# Patient Record
Sex: Male | Born: 1950 | Race: White | Hispanic: No | Marital: Married | Smoking: Former smoker
Health system: Southern US, Community
[De-identification: ages and names within clinical notes are randomized; demographics above are authoritative.]

## PROBLEM LIST (undated history)

## (undated) DIAGNOSIS — E7800 Pure hypercholesterolemia, unspecified: Secondary | ICD-10-CM

## (undated) DIAGNOSIS — K635 Polyp of colon: Secondary | ICD-10-CM

## (undated) DIAGNOSIS — T7840XA Allergy, unspecified, initial encounter: Secondary | ICD-10-CM

## (undated) DIAGNOSIS — K219 Gastro-esophageal reflux disease without esophagitis: Secondary | ICD-10-CM

## (undated) DIAGNOSIS — I10 Essential (primary) hypertension: Secondary | ICD-10-CM

## (undated) DIAGNOSIS — K589 Irritable bowel syndrome without diarrhea: Secondary | ICD-10-CM

## (undated) DIAGNOSIS — H269 Unspecified cataract: Secondary | ICD-10-CM

## (undated) DIAGNOSIS — C439 Malignant melanoma of skin, unspecified: Secondary | ICD-10-CM

## (undated) DIAGNOSIS — J189 Pneumonia, unspecified organism: Secondary | ICD-10-CM

## (undated) HISTORY — DX: Malignant melanoma of skin, unspecified: C43.9

## (undated) HISTORY — DX: Polyp of colon: K63.5

## (undated) HISTORY — DX: Gastro-esophageal reflux disease without esophagitis: K21.9

## (undated) HISTORY — PX: POLYPECTOMY: SHX149

## (undated) HISTORY — DX: Unspecified cataract: H26.9

## (undated) HISTORY — DX: Allergy, unspecified, initial encounter: T78.40XA

## (undated) HISTORY — DX: Pure hypercholesterolemia, unspecified: E78.00

## (undated) HISTORY — DX: Pneumonia, unspecified organism: J18.9

## (undated) HISTORY — PX: MOHS SURGERY: SHX181

## (undated) HISTORY — DX: Irritable bowel syndrome, unspecified: K58.9

---

## 1977-01-28 DIAGNOSIS — C439 Malignant melanoma of skin, unspecified: Secondary | ICD-10-CM

## 1977-01-28 HISTORY — PX: OTHER SURGICAL HISTORY: SHX169

## 1977-01-28 HISTORY — DX: Malignant melanoma of skin, unspecified: C43.9

## 1993-01-28 DIAGNOSIS — J189 Pneumonia, unspecified organism: Secondary | ICD-10-CM

## 1993-01-28 HISTORY — DX: Pneumonia, unspecified organism: J18.9

## 2011-01-29 DIAGNOSIS — K635 Polyp of colon: Secondary | ICD-10-CM

## 2011-01-29 HISTORY — DX: Polyp of colon: K63.5

## 2011-10-19 ENCOUNTER — Encounter (HOSPITAL_COMMUNITY): Payer: Self-pay | Admitting: *Deleted

## 2011-10-19 ENCOUNTER — Encounter (HOSPITAL_COMMUNITY): Payer: Self-pay | Admitting: Anesthesiology

## 2011-10-19 ENCOUNTER — Inpatient Hospital Stay (HOSPITAL_COMMUNITY): Payer: BC Managed Care – PPO | Admitting: Anesthesiology

## 2011-10-19 ENCOUNTER — Encounter (HOSPITAL_COMMUNITY)
Admission: EM | Disposition: A | Discharge status: Home / Self Care | Payer: Self-pay | Source: Other Acute Inpatient Hospital | Attending: Emergency Medicine

## 2011-10-19 ENCOUNTER — Inpatient Hospital Stay (HOSPITAL_COMMUNITY)
Admission: EM | Admit: 2011-10-19 | Discharge: 2011-10-23 | DRG: 002 | Disposition: A | Discharge status: Home / Self Care | Payer: BC Managed Care – PPO | Source: Other Acute Inpatient Hospital | Attending: Internal Medicine | Admitting: Internal Medicine

## 2011-10-19 DIAGNOSIS — D649 Anemia, unspecified: Secondary | ICD-10-CM | POA: Diagnosis present

## 2011-10-19 DIAGNOSIS — F1010 Alcohol abuse, uncomplicated: Secondary | ICD-10-CM

## 2011-10-19 DIAGNOSIS — S065XAA Traumatic subdural hemorrhage with loss of consciousness status unknown, initial encounter: Secondary | ICD-10-CM

## 2011-10-19 DIAGNOSIS — C439 Malignant melanoma of skin, unspecified: Secondary | ICD-10-CM

## 2011-10-19 DIAGNOSIS — I6200 Nontraumatic subdural hemorrhage, unspecified: Principal | ICD-10-CM

## 2011-10-19 DIAGNOSIS — Z85820 Personal history of malignant melanoma of skin: Secondary | ICD-10-CM

## 2011-10-19 DIAGNOSIS — G8190 Hemiplegia, unspecified affecting unspecified side: Secondary | ICD-10-CM | POA: Diagnosis present

## 2011-10-19 DIAGNOSIS — I10 Essential (primary) hypertension: Secondary | ICD-10-CM

## 2011-10-19 DIAGNOSIS — E785 Hyperlipidemia, unspecified: Secondary | ICD-10-CM | POA: Diagnosis present

## 2011-10-19 DIAGNOSIS — S065X9A Traumatic subdural hemorrhage with loss of consciousness of unspecified duration, initial encounter: Secondary | ICD-10-CM

## 2011-10-19 HISTORY — DX: Essential (primary) hypertension: I10

## 2011-10-19 HISTORY — PX: CRANIOTOMY: SHX93

## 2011-10-19 LAB — BLOOD GAS, ARTERIAL
Acid-base deficit: 0.3 mmol/L (ref 0.0–2.0)
Drawn by: 10552
FIO2: 0.21 %
O2 Saturation: 96.8 %
pCO2 arterial: 33.2 mmHg — ABNORMAL LOW (ref 35.0–45.0)
pO2, Arterial: 80.8 mmHg (ref 80.0–100.0)

## 2011-10-19 LAB — CBC
HCT: 43.3 % (ref 39.0–52.0)
Hemoglobin: 15.1 g/dL (ref 13.0–17.0)
MCV: 91.4 fL (ref 78.0–100.0)
RDW: 12.7 % (ref 11.5–15.5)
WBC: 6.8 10*3/uL (ref 4.0–10.5)

## 2011-10-19 LAB — COMPREHENSIVE METABOLIC PANEL
ALT: 22 U/L (ref 0–53)
AST: 21 U/L (ref 0–37)
Albumin: 4.4 g/dL (ref 3.5–5.2)
Calcium: 10 mg/dL (ref 8.4–10.5)
GFR calc non Af Amer: >90 mL/min (ref >90)
Glucose, Bld: 98 mg/dL (ref 70–99)
Potassium: 4 mEq/L (ref 3.5–5.1)

## 2011-10-19 LAB — PROTIME-INR: INR: 1.04 (ref 0.00–1.49)

## 2011-10-19 LAB — APTT: aPTT: 35 seconds (ref 24–37)

## 2011-10-19 LAB — PHOSPHORUS: Phosphorus: 2.4 mg/dL (ref 2.3–4.6)

## 2011-10-19 SURGERY — CRANIOTOMY HEMATOMA EVACUATION SUBDURAL
Anesthesia: General | Site: Head | Laterality: Left | Wound class: Clean

## 2011-10-19 MED ORDER — THIAMINE HCL 100 MG/ML IJ SOLN
100.0000 mg | Freq: Every day | INTRAMUSCULAR | Status: DC
  Filled 2011-10-19: qty 1

## 2011-10-19 MED ORDER — SODIUM CHLORIDE 0.9 % IV SOLN
500.0000 mg | Freq: Two times a day (BID) | INTRAVENOUS | Status: DC
  Administered 2011-10-20: 500 mg via INTRAVENOUS
  Filled 2011-10-19 (×2): qty 5

## 2011-10-19 MED ORDER — 0.9 % SODIUM CHLORIDE (POUR BTL) OPTIME
TOPICAL | Status: DC | PRN
  Administered 2011-10-19 (×2): 1000 mL

## 2011-10-19 MED ORDER — SODIUM CHLORIDE 0.9 % IJ SOLN
3.0000 mL | Freq: Two times a day (BID) | INTRAMUSCULAR | Status: DC
  Administered 2011-10-20 (×2): 3 mL via INTRAVENOUS

## 2011-10-19 MED ORDER — SODIUM CHLORIDE 0.9 % IV SOLN
INTRAVENOUS | Status: DC
  Administered 2011-10-20: via INTRAVENOUS

## 2011-10-19 MED ORDER — FENTANYL CITRATE 0.05 MG/ML IJ SOLN
INTRAMUSCULAR | Status: DC | PRN
  Administered 2011-10-19 (×2): 50 ug via INTRAVENOUS
  Administered 2011-10-19 (×3): 100 ug via INTRAVENOUS

## 2011-10-19 MED ORDER — ROCURONIUM BROMIDE 100 MG/10ML IV SOLN
INTRAVENOUS | Status: DC | PRN
  Administered 2011-10-19: 50 mg via INTRAVENOUS

## 2011-10-19 MED ORDER — HYDROMORPHONE HCL PF 1 MG/ML IJ SOLN
INTRAMUSCULAR | Status: AC
  Filled 2011-10-19: qty 1

## 2011-10-19 MED ORDER — THROMBIN 20000 UNITS EX KIT
PACK | CUTANEOUS | Status: DC | PRN
  Administered 2011-10-19: 21:00:00 via TOPICAL

## 2011-10-19 MED ORDER — SODIUM CHLORIDE 0.9 % IV SOLN: 250.0000 mL | INTRAVENOUS | Status: DC

## 2011-10-19 MED ORDER — VITAMIN B-1 100 MG PO TABS
100.0000 mg | ORAL_TABLET | Freq: Every day | ORAL | Status: DC
  Administered 2011-10-19: 100 mg via ORAL
  Filled 2011-10-19: qty 1

## 2011-10-19 MED ORDER — BACITRACIN ZINC 500 UNIT/GM EX OINT
TOPICAL_OINTMENT | CUTANEOUS | Status: DC | PRN
  Administered 2011-10-19: 1 via TOPICAL

## 2011-10-19 MED ORDER — OXYCODONE HCL 5 MG PO TABS: 5.0000 mg | ORAL_TABLET | Freq: Once | ORAL | Status: AC | PRN

## 2011-10-19 MED ORDER — LIDOCAINE HCL (CARDIAC) 20 MG/ML IV SOLN
INTRAVENOUS | Status: DC | PRN
  Administered 2011-10-19 (×2): 100 mg via INTRAVENOUS

## 2011-10-19 MED ORDER — FOLIC ACID 1 MG PO TABS
1.0000 mg | ORAL_TABLET | Freq: Every day | ORAL | Status: DC
  Administered 2011-10-19: 1 mg via ORAL
  Filled 2011-10-19: qty 1

## 2011-10-19 MED ORDER — SODIUM CHLORIDE 0.9 % IV SOLN
10.0000 mg | INTRAVENOUS | Status: DC | PRN
  Administered 2011-10-19: 25 ug/min via INTRAVENOUS

## 2011-10-19 MED ORDER — SODIUM CHLORIDE 0.9 % IV SOLN
INTRAVENOUS | Status: DC | PRN
  Administered 2011-10-19 (×2): via INTRAVENOUS

## 2011-10-19 MED ORDER — SODIUM CHLORIDE 0.9 % IV SOLN
INTRAVENOUS | Status: AC
  Filled 2011-10-19: qty 500

## 2011-10-19 MED ORDER — DEXTROSE 5 % IV SOLN
INTRAVENOUS | Status: DC | PRN
  Administered 2011-10-19: 21:00:00 via INTRAVENOUS

## 2011-10-19 MED ORDER — SODIUM CHLORIDE 0.9 % IV SOLN
INTRAVENOUS | Status: DC
  Administered 2011-10-19: 17:00:00 via INTRAVENOUS

## 2011-10-19 MED ORDER — PROPOFOL 10 MG/ML IV BOLUS
INTRAVENOUS | Status: DC | PRN
  Administered 2011-10-19: 200 mg via INTRAVENOUS
  Administered 2011-10-19 (×2): 50 mg via INTRAVENOUS

## 2011-10-19 MED ORDER — NICARDIPINE HCL IN NACL 20-0.86 MG/200ML-% IV SOLN
INTRAVENOUS | Status: DC | PRN
  Administered 2011-10-19: 5 mg/h via INTRAVENOUS

## 2011-10-19 MED ORDER — BUPIVACAINE HCL (PF) 0.25 % IJ SOLN
INTRAMUSCULAR | Status: DC | PRN
  Administered 2011-10-19: 5 mL

## 2011-10-19 MED ORDER — ADULT MULTIVITAMIN W/MINERALS CH
1.0000 | ORAL_TABLET | Freq: Every day | ORAL | Status: DC
  Administered 2011-10-19: 1 via ORAL
  Filled 2011-10-19: qty 1

## 2011-10-19 MED ORDER — PANTOPRAZOLE SODIUM 40 MG IV SOLR
40.0000 mg | Freq: Every day | INTRAVENOUS | Status: DC
  Filled 2011-10-19: qty 40

## 2011-10-19 MED ORDER — ONDANSETRON HCL 4 MG/2ML IJ SOLN: 4.0000 mg | Freq: Once | INTRAMUSCULAR | Status: AC | PRN

## 2011-10-19 MED ORDER — CEFAZOLIN SODIUM 1-5 GM-% IV SOLN
INTRAVENOUS | Status: AC
  Administered 2011-10-19: 2 g via INTRAVENOUS
  Filled 2011-10-19: qty 100

## 2011-10-19 MED ORDER — LORAZEPAM 2 MG/ML IJ SOLN: 1.0000 mg | Freq: Four times a day (QID) | INTRAMUSCULAR | Status: DC | PRN

## 2011-10-19 MED ORDER — MICROFIBRILLAR COLL HEMOSTAT EX PADS
MEDICATED_PAD | CUTANEOUS | Status: DC | PRN
  Administered 2011-10-19: 1 via TOPICAL

## 2011-10-19 MED ORDER — ONDANSETRON HCL 4 MG/2ML IJ SOLN
INTRAMUSCULAR | Status: DC | PRN
  Administered 2011-10-19: 4 mg via INTRAVENOUS

## 2011-10-19 MED ORDER — SODIUM CHLORIDE 0.9 % IR SOLN
Status: DC | PRN
  Administered 2011-10-19: 21:00:00

## 2011-10-19 MED ORDER — NICARDIPINE HCL IN NACL 20-0.86 MG/200ML-% IV SOLN
5.0000 mg/h | INTRAVENOUS | Status: DC
Start: 2011-10-19 — End: 2011-10-19
  Filled 2011-10-19 (×2): qty 200

## 2011-10-19 MED ORDER — LABETALOL HCL 5 MG/ML IV SOLN
INTRAVENOUS | Status: DC | PRN
  Administered 2011-10-19: 10 mg via INTRAVENOUS
  Administered 2011-10-19: 5 mg via INTRAVENOUS

## 2011-10-19 MED ORDER — GLYCOPYRROLATE 0.2 MG/ML IJ SOLN
INTRAMUSCULAR | Status: DC | PRN
  Administered 2011-10-19: .6 mg via INTRAVENOUS

## 2011-10-19 MED ORDER — SODIUM CHLORIDE 0.9 % IJ SOLN: 3.0000 mL | INTRAMUSCULAR | Status: DC | PRN

## 2011-10-19 MED ORDER — OXYCODONE HCL 5 MG/5ML PO SOLN: 5.0000 mg | Freq: Once | ORAL | Status: AC | PRN

## 2011-10-19 MED ORDER — NEOSTIGMINE METHYLSULFATE 1 MG/ML IJ SOLN
INTRAMUSCULAR | Status: DC | PRN
  Administered 2011-10-19: 5 mg via INTRAVENOUS

## 2011-10-19 MED ORDER — LIDOCAINE-EPINEPHRINE 1 %-1:100000 IJ SOLN
INTRAMUSCULAR | Status: DC | PRN
  Administered 2011-10-19: 5 mL via INTRADERMAL

## 2011-10-19 MED ORDER — BACITRACIN 50000 UNITS IM SOLR
INTRAMUSCULAR | Status: AC
  Filled 2011-10-19: qty 1

## 2011-10-19 MED ORDER — HYDROMORPHONE HCL PF 1 MG/ML IJ SOLN
0.2500 mg | INTRAMUSCULAR | Status: DC | PRN
  Administered 2011-10-19 (×4): 0.5 mg via INTRAVENOUS

## 2011-10-19 MED ORDER — LORAZEPAM 1 MG PO TABS: 1.0000 mg | ORAL_TABLET | Freq: Four times a day (QID) | ORAL | Status: DC | PRN

## 2011-10-19 SURGICAL SUPPLY — 74 items
BAG DECANTER FOR FLEXI CONT (MISCELLANEOUS) ×2 IMPLANT
BANDAGE GAUZE 4  KLING STR (GAUZE/BANDAGES/DRESSINGS) ×4 IMPLANT
BANDAGE GAUZE ELAST BULKY 4 IN (GAUZE/BANDAGES/DRESSINGS) ×6 IMPLANT
BIT DRILL WIRE PASS 1.3MM (BIT) IMPLANT
BRUSH SCRUB EZ PLAIN DRY (MISCELLANEOUS) ×2 IMPLANT
BUR ACORN 6.0 (BURR) ×2 IMPLANT
BUR ADDG 1.1 (BURR) IMPLANT
BUR ROUTER D-58 CRANI (BURR) ×2 IMPLANT
CANISTER SUCTION 2500CC (MISCELLANEOUS) ×2 IMPLANT
CLIP TI MEDIUM 6 (CLIP) IMPLANT
CLOTH BEACON ORANGE TIMEOUT ST (SAFETY) ×2 IMPLANT
CONT SPEC 4OZ CLIKSEAL STRL BL (MISCELLANEOUS) ×2 IMPLANT
CORDS BIPOLAR (ELECTRODE) ×2 IMPLANT
DECANTER SPIKE VIAL GLASS SM (MISCELLANEOUS) ×2 IMPLANT
DRAIN CHANNEL 10M FLAT 3/4 FLT (DRAIN) IMPLANT
DRAIN PENROSE 1/2X12 LTX STRL (WOUND CARE) IMPLANT
DRAIN SNY WOU 7FLT (WOUND CARE) ×2 IMPLANT
DRAPE SURG IRRIG POUCH 19X23 (DRAPES) IMPLANT
DRAPE WARM FLUID 44X44 (DRAPE) ×2 IMPLANT
DRILL WIRE PASS 1.3MM (BIT)
DRSG ADAPTIC 3X8 NADH LF (GAUZE/BANDAGES/DRESSINGS) IMPLANT
DRSG PAD ABDOMINAL 8X10 ST (GAUZE/BANDAGES/DRESSINGS) IMPLANT
DURAPREP 6ML APPLICATOR 50/CS (WOUND CARE) ×2 IMPLANT
ELECT CAUTERY BLADE 6.4 (BLADE) ×2 IMPLANT
ELECT REM PT RETURN 9FT ADLT (ELECTROSURGICAL) ×2
ELECTRODE REM PT RTRN 9FT ADLT (ELECTROSURGICAL) ×1 IMPLANT
EVACUATOR SILICONE 100CC (DRAIN) ×2 IMPLANT
GAUZE SPONGE 4X4 16PLY XRAY LF (GAUZE/BANDAGES/DRESSINGS) IMPLANT
GLOVE BIO SURGEON STRL SZ7 (GLOVE) ×6 IMPLANT
GLOVE BIO SURGEON STRL SZ7.5 (GLOVE) IMPLANT
GLOVE BIOGEL PI IND STRL 7.5 (GLOVE) IMPLANT
GLOVE BIOGEL PI IND STRL 8.5 (GLOVE) ×2 IMPLANT
GLOVE BIOGEL PI INDICATOR 7.5 (GLOVE)
GLOVE BIOGEL PI INDICATOR 8.5 (GLOVE) ×2
GLOVE ECLIPSE 8.5 STRL (GLOVE) ×4 IMPLANT
GLOVE EXAM NITRILE LRG STRL (GLOVE) IMPLANT
GLOVE EXAM NITRILE MD LF STRL (GLOVE) IMPLANT
GLOVE EXAM NITRILE XL STR (GLOVE) IMPLANT
GLOVE EXAM NITRILE XS STR PU (GLOVE) IMPLANT
GLOVE INDICATOR 7.0 STRL GRN (GLOVE) ×4 IMPLANT
GOWN BRE IMP SLV AUR LG STRL (GOWN DISPOSABLE) ×2 IMPLANT
GOWN BRE IMP SLV AUR XL STRL (GOWN DISPOSABLE) ×2 IMPLANT
GOWN STRL REIN 2XL LVL4 (GOWN DISPOSABLE) ×2 IMPLANT
HEMOSTAT SURGICEL 2X14 (HEMOSTASIS) IMPLANT
HOOK DURA (MISCELLANEOUS) IMPLANT
KIT BASIN OR (CUSTOM PROCEDURE TRAY) ×2 IMPLANT
KIT ROOM TURNOVER OR (KITS) ×2 IMPLANT
NEEDLE HYPO 22GX1.5 SAFETY (NEEDLE) ×2 IMPLANT
NS IRRIG 1000ML POUR BTL (IV SOLUTION) ×2 IMPLANT
PACK CRANIOTOMY (CUSTOM PROCEDURE TRAY) ×2 IMPLANT
PATTIES SURGICAL .5 X.5 (GAUZE/BANDAGES/DRESSINGS) IMPLANT
PATTIES SURGICAL .5 X3 (DISPOSABLE) IMPLANT
PATTIES SURGICAL 1X1 (DISPOSABLE) IMPLANT
PIN MAYFIELD SKULL DISP (PIN) ×2 IMPLANT
PLATE 1.5  2HOLE LNG NEURO (Plate) ×4 IMPLANT
PLATE 1.5 2HOLE LNG NEURO (Plate) ×4 IMPLANT
SCREW SELF DRILL HT 1.5/4MM (Screw) ×16 IMPLANT
SPECIMEN JAR SMALL (MISCELLANEOUS) IMPLANT
SPONGE GAUZE 4X4 12PLY (GAUZE/BANDAGES/DRESSINGS) ×4 IMPLANT
SPONGE NEURO XRAY DETECT 1X3 (DISPOSABLE) IMPLANT
SPONGE SURGIFOAM ABS GEL 100 (HEMOSTASIS) IMPLANT
STAPLER SKIN PROX WIDE 3.9 (STAPLE) ×2 IMPLANT
SUT ETHILON 3 0 FSL (SUTURE) ×2 IMPLANT
SUT NURALON 4 0 TR CR/8 (SUTURE) ×4 IMPLANT
SUT VIC AB 2-0 CP2 18 (SUTURE) ×4 IMPLANT
SYR 20ML ECCENTRIC (SYRINGE) ×2 IMPLANT
SYR CONTROL 10ML LL (SYRINGE) ×2 IMPLANT
TAPE CLOTH 1X10 TAN NS (GAUZE/BANDAGES/DRESSINGS) ×2 IMPLANT
TOWEL OR 17X24 6PK STRL BLUE (TOWEL DISPOSABLE) ×2 IMPLANT
TOWEL OR 17X26 10 PK STRL BLUE (TOWEL DISPOSABLE) ×2 IMPLANT
TRAP SPECIMEN MUCOUS 40CC (MISCELLANEOUS) IMPLANT
TRAY FOLEY CATH 14FRSI W/METER (CATHETERS) ×2 IMPLANT
UNDERPAD 30X30 INCONTINENT (UNDERPADS AND DIAPERS) IMPLANT
WATER STERILE IRR 1000ML POUR (IV SOLUTION) ×2 IMPLANT

## 2011-10-19 NOTE — Anesthesia Postprocedure Evaluation (Signed)
  Anesthesia Post-op Note  Patient: Richard Kramer  Procedure(s) Performed: Procedure(s) (LRB) with comments: CRANIOTOMY HEMATOMA EVACUATION SUBDURAL (Left) - Craniotomy for subdural hematoma  Patient Location: PACU  Anesthesia Type: General  Level of Consciousness: awake and alert   Airway and Oxygen Therapy: Patient Spontanous Breathing and Patient connected to nasal cannula oxygen  Post-op Pain: mild  Post-op Assessment: Post-op Vital signs reviewed  Post-op Vital Signs: Reviewed  Complications: No apparent anesthesia complications

## 2011-10-19 NOTE — Op Note (Signed)
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 endotracheal Indications: Patient is a 61 year old individual is had significant headache for some period of time and now has developed right lower extremity weakness some confusion unsteadiness and gait. An MRI of the brain was performed and this demonstrated an acute on chronic left frontoparietal subdural hematoma. The hematoma was multiloculated and required craniotomy for evacuation.  Procedure the patient was brought to the operating room placed on the table in supine position after the smooth induction of general endotracheal anesthesia and the placement of appropriate monitoring lines including an arterial line and a Foley catheter the patient was placed in a 3 pin headrest with the head turned to the right a bump placed on the left shoulder the head was shaved was prepped with alcohol and DuraPrep and draped in a sterile fashion linear incision was created from the posterior aspect of the ear to the vertex of the scalp. Hemostasis was maintained and the galea the bipolar cautery and some rainy clips of the underlying periosteum was stripped from the bone and a large craniotomy flap was raised in this region toward the frontal region in the parietal region the bone was laid aside. The dura was noted to be tense it is discolored a dark brownish purplish U. A linear incision was created in the dura and it was opened a cruciate fashion relieving substantial pressure from fluid that was the color of motor oil and thick. There was portions of acute blood clot in this area also and these were removed the wound was irrigated copiously septated membranes were noted and these  were opened. A visceral membrane was noted on the surface of the brain this was incised and then by urinating underneath the membrane could be lifted and it was opened to connect the subdural space and remove a good portion of the visceral membrane off the brain. A sample of this membrane was sent for pathologic examination. The area was carefully inspected under the edges of the bone flap and hematoma and membrane were opened into the farthest reaches of the craniotomy defect. Once this was accomplished and hemostasis was well achieved the dural edges were closed loosely after being cauterized hemostasis. A 7 mm Jackson-Pratt drain was then placed in the posterior aspect of the craniotomy into the frontal pole. This was brought out through separate stab incision. The bone flap was then replaced after placing some tack up sutures in the center of the flap and placing tack ups around the perimetry. For small titanium plates were used to secure the bone flap in position. Central tack up was placed. Once this was accomplished the wound was copiously irrigated with antibiotic irrigant solution and then the galea was closed with 2-0 Vicryl in an inverted interrupted fashion and surgical staples were placed in the skin blood loss was estimated at 100 cc the drain was connected to a grenade which was not charged. The patient tolerated procedure well.

## 2011-10-19 NOTE — Preoperative (Signed)
Beta Blockers   Reason not to administer Beta Blockers:Not Applicable 

## 2011-10-19 NOTE — Anesthesia Procedure Notes (Signed)
Procedure Name: Intubation Date/Time: 10/19/2011 8:33 PM Performed by: Wray Kearns A Pre-anesthesia Checklist: Patient identified, Timeout performed, Emergency Drugs available, Suction available and Patient being monitored Patient Re-evaluated:Patient Re-evaluated prior to inductionOxygen Delivery Method: Circle system utilized Preoxygenation: Pre-oxygenation with 100% oxygen Intubation Type: IV induction and Cricoid Pressure applied Ventilation: Mask ventilation without difficulty and Oral airway inserted - appropriate to patient size Laryngoscope Size: Mac and 3 Grade View: Grade I Tube type: Oral Tube size: 8.0 mm Number of attempts: 1 Airway Equipment and Method: Stylet Placement Confirmation: ETT inserted through vocal cords under direct vision,  breath sounds checked- equal and bilateral and positive ETCO2 Secured at: 22 cm Tube secured with: Tape Dental Injury: Teeth and Oropharynx as per pre-operative assessment

## 2011-10-19 NOTE — H&P (Signed)
Name: Richard Kramer MRN: [id number] DOB: 06-29-1950    LOS: 0  Referring Provider:  Danielle Dess Reason for Referral: Acute on chronic SDH.  PULMONARY / CRITICAL CARE MEDICINE  HPI:  61 yo WM 5-6 beer per day smoker x 30 years, former smoker, unemployed who was diganosed with acute on chronic SDH when he developed Left arm weakness. He was transported from [facility] on 9/21 for the services of Dr. Danielle Dess of NSGY. PCCM admitted. PMH: HTN etoh abuse melanoma removal in 70's Prior to Admission medications   Pending HTN med Allergies PCN Family History Reviewed Social History Former smoker quit 4 years ago. Unemployed married. Review Of Systems:  Taken extensively see hpi  Brief patient description:  WNWDWMNAD  Events Since Admission: 9/21 admitted to 3100  Current Status: Stable Vital Signs: No data found.     Physical Examination: General:  *WNWDWM Neuro:  Intact HEENT:  No LAN Neck:  No Jvd Cardiovascular:  HSR RRR Lungs:  CTA Abdomen:  Soft +bs Musculoskeletal:  intact Skin:  Old scar rt kidney area Active Problems:  Subdural hematoma, acute  HTN (hypertension)  Melanoma  ETOH abuse  ASSESSMENT AND PLAN  PULMONARY No results found for this basename: PHART:5,PCO2:5,PCO2ART:5,PO2ART:5,HCO3:5,O2SAT:5 in the last 168 hours Ventilator Settings:   ETT:    A:  Former smoker. No acute issue   P:     CARDIOVASCULAR No results found for this basename: TROPONINI:5,LATICACIDVEN:5, O2SATVEN:5,PROBNP:5 in the last 168 hours ECG:   Lines:   A  HTN P:  >home meds if SBP > 140, DBP > 85  RENAL No results found for this basename: NA:5,K:2,CL:5,CO2:5,BUN:5,CREATININE:5,CALCIUM:5,MG:5,PHOS:5 in the last 168 hours Intake/Output    None    Foley:    A:  No acute issue   P:   - am BMP  GASTROINTESTINAL No results found for this basename: AST:5,ALT:5,ALKPHOS:5,BILITOT:5,PROT:5,ALBUMIN:5 in the last 168 hours  A:  No acute issue   P:      HEMATOLOGIC No results found for this basename: HGB:5,HCT:5,PLT:5,INR:5,APTT:5 in the last 168 hours A:  No acute issue   P:  - am CBC  INFECTIOUS No results found for this basename: WBC:5,PROCALCITON:5 in the last 168 hours Cultures:  Antibiotics:  A:  No acute issue   P:     ENDOCRINE No results found for this basename: GLUCAP:5 in the last 168 hours A:  No acute issue   P:   - check CBG qam  NEUROLOGIC  A:  Acute on chronic SDH Etoh abuse P:   >NSGY eval pending, suspect he will require surgical evacuation >ciwa protocol  BEST PRACTICE / DISPOSITION Level of Care:  ICU Primary Service:  PCCM Consultants:  NS Code Status:  Full Diet:  NPO DVT Px:  PAS GI Px:  PPI Skin Integrity:  Intact  Social / Family:  None at bedside  [facility] Minor ACNP Adolph Pollack PCCM Pager [phone] till 3 pm If no answer page [phone] 10/19/2011, 4:49 PM

## 2011-10-19 NOTE — Consult Note (Signed)
Reason for Consult: Subdural hematoma Referring Physician: Dr. Marcelline Mates  Richard Kramer is an 61 y.o. male.  HPI: Patient is a 62 year old male who has a history of some moderate alcohol intake who apparently has been having increasing headaches over the past several weeks time and more recently developed weakness in the right lower extremity with unsteadiness of his gait an MRI of the brain was ordered and this was performed today and demonstrated the presence of a large left frontoparietal subdural hematoma with substantial shift of 11 mm of the midline patient has some mild to moderate atrophy also noted because of the amount of shift in the patients clinical status he was advised to have neurosurgical opinion which was obtained from the [facility] ER and I was contacted the scans demonstrate substantial shift as indicated advise transfer for further care which would likely include surgery but also medical care of his underlying conditions  Past Medical History  Diagnosis Date  . Hypertension     No past surgical history on file.  No family history on file.  Social History:  does not have a smoking history on file. He does not have any smokeless tobacco history on file. His alcohol and drug histories not on file.  Allergies:  Allergies  Allergen Reactions  . Penicillins Rash    Medications: Have not reviewed the patient's medication  Results for orders placed during the hospital encounter of 10/19/11 (from the past 48 hour(s))  COMPREHENSIVE METABOLIC PANEL     Status: Normal   Collection Time   10/19/11  4:52 PM      Component Value Range Comment   Sodium 138  135 - 145 mEq/L    Potassium 4.0  3.5 - 5.1 mEq/L    Chloride 100  96 - 112 mEq/L    CO2 25  19 - 32 mEq/L    Glucose, Bld 98  70 - 99 mg/dL    BUN 9  6 - 23 mg/dL    Creatinine, Ser 1.61  0.50 - 1.35 mg/dL    Calcium 09.6  8.4 - 10.5 mg/dL    Total Protein 7.0  6.0 - 8.3 g/dL    Albumin 4.4  3.5 - 5.2  g/dL    AST 21  0 - 37 U/L    ALT 22  0 - 53 U/L    Alkaline Phosphatase 63  39 - 117 U/L    Total Bilirubin 0.5  0.3 - 1.2 mg/dL    GFR calc non Af Amer >90  >90 mL/min    GFR calc Af Amer >90  >90 mL/min   MAGNESIUM     Status: Normal   Collection Time   10/19/11  4:52 PM      Component Value Range Comment   Magnesium 2.2  1.5 - 2.5 mg/dL   PHOSPHORUS     Status: Normal   Collection Time   10/19/11  4:52 PM      Component Value Range Comment   Phosphorus 2.4  2.3 - 4.6 mg/dL   CBC     Status: Normal   Collection Time   10/19/11  4:52 PM      Component Value Range Comment   WBC 6.8  4.0 - 10.5 K/uL    RBC 4.74  4.22 - 5.81 MIL/uL    Hemoglobin 15.1  13.0 - 17.0 g/dL    HCT 04.5  40.9 - 81.1 %    MCV 91.4  78.0 - 100.0 fL  MCH 31.9  26.0 - 34.0 pg    MCHC 34.9  30.0 - 36.0 g/dL    RDW 96.0  45.4 - 09.8 %    Platelets 266  150 - 400 K/uL   PROTIME-INR     Status: Normal   Collection Time   10/19/11  4:52 PM      Component Value Range Comment   Prothrombin Time 13.5  11.6 - 15.2 seconds    INR 1.04  0.00 - 1.49   APTT     Status: Normal   Collection Time   10/19/11  4:52 PM      Component Value Range Comment   aPTT 35  24 - 37 seconds   BLOOD GAS, ARTERIAL     Status: Abnormal   Collection Time   10/19/11  5:00 PM      Component Value Range Comment   FIO2 0.21      O2 Content ROOM AIR      pH, Arterial 7.458 (*) 7.350 - 7.450    pCO2 arterial 33.2 (*) 35.0 - 45.0 mmHg    pO2, Arterial 80.8  80.0 - 100.0 mmHg    Bicarbonate 23.2  20.0 - 24.0 mEq/L    TCO2 24.2  0 - 100 mmol/L    Acid-base deficit 0.3  0.0 - 2.0 mmol/L    O2 Saturation 96.8      Patient temperature 98.6      Collection site RIGHT RADIAL      Drawn by [id number]      Sample type ARTERIAL      Allens test (pass/fail) PASS  PASS     No results found.  Review of Systems  HENT: Negative.   Eyes: Negative.   Respiratory: Negative.   Cardiovascular: Negative.   Gastrointestinal: Negative.     Musculoskeletal: Positive for falls.  Skin: Negative.   Neurological: Positive for dizziness, tingling, tremors, sensory change, focal weakness and weakness.       Weak right lower extremity  Endo/Heme/Allergies: Negative.   Psychiatric/Behavioral: Positive for depression. The patient is nervous/anxious.    There were no vitals taken for this visit. Physical Exam  Constitutional: He is oriented to person, place, and time. He appears well-developed and well-nourished.  HENT:  Head: Normocephalic and atraumatic.  Eyes: Conjunctivae normal and EOM are normal. Pupils are equal, round, and reactive to light.  Neck: Normal range of motion. Neck supple.  Cardiovascular: Normal rate and regular rhythm.   Respiratory: Effort normal and breath sounds normal.  GI: Soft. Bowel sounds are normal.  Musculoskeletal: Normal range of motion.  Neurological: He is alert and oriented to person, place, and time. Coordination abnormal.       Slightly unstable on feet positive Romberg's exam no evidence of upper extremity drift but lower extremity asymmetry of movement noted  Skin: Skin is warm and dry.  Psychiatric: He has a normal mood and affect. His behavior is normal.    Assessment/Plan: Large left frontoparietal subdural hematoma with 11 mm of midline shift the patient does have some modest atrophy however he has some evidence of weakness of his right side.  I would advise the patient that he should undergo surgical craniotomy for resection of membranes and evacuation of subdural hematoma the risks were discussed at length with patient his wife and son who accompany him I indicated that the surgery would require an incision on the scalp risk that is largest is that of reaccumulation of the hematoma indicated there is  also risk of stroke and even risk of death nonetheless given the size of this hematoma and the patient's fairly good status but with evidence of significant neurologic impingement I have  advised surgical decompression via a craniotomy.  Richard Kramer J 10/19/2011, 7:02 PM

## 2011-10-19 NOTE — Anesthesia Preprocedure Evaluation (Signed)
Anesthesia Evaluation  Patient identified by MRN, date of birth, ID band Patient awake    Reviewed: Allergy & Precautions, H&P , NPO status , Patient's Chart, lab work & pertinent test results  Airway Mallampati: I TM Distance: >3 FB Neck ROM: Full    Dental  (+) Teeth Intact and Dental Advisory Given   Pulmonary  breath sounds clear to auscultation        Cardiovascular hypertension, Pt. on medications Rhythm:Regular Rate:Normal     Neuro/Psych    GI/Hepatic   Endo/Other    Renal/GU      Musculoskeletal   Abdominal   Peds  Hematology   Anesthesia Other Findings   Reproductive/Obstetrics                           Anesthesia Physical Anesthesia Plan  ASA: II  Anesthesia Plan: General   Post-op Pain Management:    Induction: Intravenous  Airway Management Planned: Oral ETT  Additional Equipment: Arterial line  Intra-op Plan:   Post-operative Plan: Extubation in OR  Informed Consent: I have reviewed the patients History and Physical, chart, labs and discussed the procedure including the risks, benefits and alternatives for the proposed anesthesia with the patient or authorized representative who has indicated his/her understanding and acceptance.   Dental advisory given  Plan Discussed with: CRNA, Anesthesiologist and Surgeon  Anesthesia Plan Comments:         Anesthesia Quick Evaluation  

## 2011-10-19 NOTE — Transfer of Care (Signed)
Immediate Anesthesia Transfer of Care Note  Patient: Richard Kramer  Procedure(s) Performed: Procedure(s) (LRB) with comments: CRANIOTOMY HEMATOMA EVACUATION SUBDURAL (Left) - Craniotomy for subdural hematoma  Patient Location: NICU  Anesthesia Type: General  Level of Consciousness: oriented, sedated, patient cooperative and responds to stimulation  Airway & Oxygen Therapy: Patient Spontanous Breathing and Patient connected to face mask oxygen  Post-op Assessment: Report given to  RN,Pt in NICU, Post -op Vital signs reviewed and stable, Patient moving all extremities and Patient moving all extremities X 4  Post vital signs: Reviewed and stable  Complications: No apparent anesthesia complications

## 2011-10-20 ENCOUNTER — Inpatient Hospital Stay (HOSPITAL_COMMUNITY): Payer: BC Managed Care – PPO

## 2011-10-20 ENCOUNTER — Encounter (HOSPITAL_COMMUNITY): Payer: Self-pay | Admitting: *Deleted

## 2011-10-20 LAB — BASIC METABOLIC PANEL
CO2: 22 mEq/L (ref 19–32)
Calcium: 8 mg/dL — ABNORMAL LOW (ref 8.4–10.5)
GFR calc Af Amer: >90 mL/min (ref >90)
Sodium: 137 mEq/L (ref 135–145)

## 2011-10-20 LAB — CBC
MCH: 31.6 pg (ref 26.0–34.0)
Platelets: 215 10*3/uL (ref 150–400)
RBC: 3.73 MIL/uL — ABNORMAL LOW (ref 4.22–5.81)
RDW: 12.9 % (ref 11.5–15.5)
WBC: 8.5 10*3/uL (ref 4.0–10.5)

## 2011-10-20 MED ORDER — ALUM & MAG HYDROXIDE-SIMETH 200-200-20 MG/5ML PO SUSP: 30.0000 mL | Freq: Four times a day (QID) | ORAL | Status: DC | PRN

## 2011-10-20 MED ORDER — VITAMIN B-1 100 MG PO TABS
100.0000 mg | ORAL_TABLET | Freq: Every day | ORAL | Status: DC
Start: 2011-10-20 — End: 2011-10-23
  Administered 2011-10-20 – 2011-10-23 (×4): 100 mg via ORAL
  Filled 2011-10-20 (×4): qty 1

## 2011-10-20 MED ORDER — THIAMINE HCL 100 MG/ML IJ SOLN
100.0000 mg | Freq: Every day | INTRAMUSCULAR | Status: DC
  Filled 2011-10-20: qty 1

## 2011-10-20 MED ORDER — LISINOPRIL 20 MG PO TABS
20.0000 mg | ORAL_TABLET | Freq: Every morning | ORAL | Status: DC
  Administered 2011-10-20 – 2011-10-23 (×4): 20 mg via ORAL
  Filled 2011-10-20 (×4): qty 1

## 2011-10-20 MED ORDER — MENTHOL 3 MG MT LOZG: 1.0000 | LOZENGE | OROMUCOSAL | Status: DC | PRN

## 2011-10-20 MED ORDER — CEFAZOLIN SODIUM 1-5 GM-% IV SOLN
1.0000 g | Freq: Three times a day (TID) | INTRAVENOUS | Status: AC
  Administered 2011-10-20 (×2): 1 g via INTRAVENOUS
  Filled 2011-10-20 (×2): qty 50

## 2011-10-20 MED ORDER — ACETAMINOPHEN 325 MG PO TABS: 650.0000 mg | ORAL_TABLET | ORAL | Status: DC | PRN

## 2011-10-20 MED ORDER — BIOTENE DRY MOUTH MT LIQD
15.0000 mL | Freq: Two times a day (BID) | OROMUCOSAL | Status: DC
  Administered 2011-10-20: 15 mL via OROMUCOSAL

## 2011-10-20 MED ORDER — PHENOL 1.4 % MT LIQD: 1.0000 | OROMUCOSAL | Status: DC | PRN

## 2011-10-20 MED ORDER — LORAZEPAM 2 MG/ML IJ SOLN: 1.0000 mg | Freq: Four times a day (QID) | INTRAMUSCULAR | Status: AC | PRN

## 2011-10-20 MED ORDER — LEVETIRACETAM 500 MG PO TABS
500.0000 mg | ORAL_TABLET | Freq: Two times a day (BID) | ORAL | Status: DC
  Administered 2011-10-20 – 2011-10-23 (×7): 500 mg via ORAL
  Filled 2011-10-20 (×10): qty 1

## 2011-10-20 MED ORDER — MORPHINE SULFATE 2 MG/ML IJ SOLN
1.0000 mg | INTRAMUSCULAR | Status: DC | PRN
  Administered 2011-10-20: 4 mg via INTRAVENOUS
  Administered 2011-10-20 (×2): 2 mg via INTRAVENOUS
  Filled 2011-10-20 (×2): qty 1
  Filled 2011-10-20: qty 2

## 2011-10-20 MED ORDER — NICARDIPINE HCL IN NACL 20-0.86 MG/200ML-% IV SOLN
5.0000 mg/h | INTRAVENOUS | Status: DC
  Filled 2011-10-20: qty 200

## 2011-10-20 MED ORDER — ACETAMINOPHEN 650 MG RE SUPP: 650.0000 mg | RECTAL | Status: DC | PRN

## 2011-10-20 MED ORDER — SIMVASTATIN 40 MG PO TABS
40.0000 mg | ORAL_TABLET | Freq: Every day | ORAL | Status: DC
  Administered 2011-10-20 – 2011-10-22 (×3): 40 mg via ORAL
  Filled 2011-10-20 (×4): qty 1

## 2011-10-20 MED ORDER — ADULT MULTIVITAMIN W/MINERALS CH
1.0000 | ORAL_TABLET | Freq: Every day | ORAL | Status: DC
  Administered 2011-10-20 – 2011-10-23 (×4): 1 via ORAL
  Filled 2011-10-20 (×4): qty 1

## 2011-10-20 MED ORDER — FOLIC ACID 1 MG PO TABS
1.0000 mg | ORAL_TABLET | Freq: Every day | ORAL | Status: DC
  Administered 2011-10-20 – 2011-10-23 (×4): 1 mg via ORAL
  Filled 2011-10-20 (×4): qty 1

## 2011-10-20 MED ORDER — DICYCLOMINE HCL 10 MG PO CAPS
10.0000 mg | ORAL_CAPSULE | Freq: Four times a day (QID) | ORAL | Status: DC | PRN
  Filled 2011-10-20: qty 2

## 2011-10-20 MED ORDER — ONDANSETRON HCL 4 MG/2ML IJ SOLN: 4.0000 mg | INTRAMUSCULAR | Status: DC | PRN

## 2011-10-20 MED ORDER — LORAZEPAM 1 MG PO TABS: 1.0000 mg | ORAL_TABLET | Freq: Four times a day (QID) | ORAL | Status: AC | PRN

## 2011-10-20 MED ORDER — OXYCODONE-ACETAMINOPHEN 5-325 MG PO TABS
1.0000 | ORAL_TABLET | ORAL | Status: DC | PRN
  Administered 2011-10-20 – 2011-10-23 (×12): 2 via ORAL
  Filled 2011-10-20 (×12): qty 2

## 2011-10-20 NOTE — Progress Notes (Signed)
Name: Richard Kramer MRN: [id number] DOB: 03/29/1950    LOS: 1  Referring Provider:  Danielle Dess Reason for Referral:  Subdural hematoma  PULMONARY / CRITICAL CARE MEDICINE  HPI: 61 yo former smoker present to [place] with increasing headaches for several weeks associated with Rt leg weakness and unsteady gait.  MRI brain showed large Lt frotonparietal SDH with mid-line shift.  Pt transferred to [facility] 10/19/2011 for neurosurgical evaluation, and had craniotomy with hematoma evacuation 9/21.  PCCM asked to admit because pt is in ICU. PMHx HTN, ETOH, Hyperlipidemia  Events Since Admission: 9/21 Craniotomy  Current Status: Headache improved with pain meds.  Denies chest pain, abd pain, or dyspnea.  Denies any hx of ETOH withdrawal symptoms or ETOH related seizures.  Vital Signs: Temp:  [97.6 F (36.4 C)-98.8 F (37.1 C)] 98 F (36.7 C) (09/22 0400) Pulse Rate:  [66-88] 77  (09/22 0700) Resp:  [8-20] 17  (09/22 0700) BP: (97-151)/(58-85) 128/74 mmHg (09/22 0700) SpO2:  [98 %-100 %] 100 % (09/22 0700) Arterial Line BP: (103-139)/(53-70) 136/65 mmHg (09/22 0700) Weight:  [139 lb 1.8 oz (63.1 kg)-153 lb 14.1 oz (69.8 kg)] 153 lb 14.1 oz (69.8 kg) (09/22 0400)  Physical Examination: General - no distress HEENT - Wound dressing clean, pupils reactice Cardiac - s1s2 regular Chest - no wheeze Abd - soft, non-tender Ext - no edema Neuro - Awake, alert, follows commands, normal strength Psych - normal mood, behavior   ASSESSMENT AND PLAN  PULMONARY  Lab 10/19/11 1700  PHART 7.458*  PCO2ART 33.2*  PO2ART 80.8  HCO3 23.2  O2SAT 96.8   A: Hx of smoking. P:   Bronchial hygiene Oxygen as needed to keep SpO2 > 92%  CARDIOVASCULAR  Lines:  Rt radial aline 9/21>>9/22  A: Hx of HTN, hyperlipidemia. P:  Hold ASA in setting of SDH Continue lisinopril, simvastatin  RENAL  Lab 10/20/11 0800 10/19/11 1652  NA 137 138  K 3.8 4.0  CL 105 100  CO2 22 25  BUN 7 9  CREATININE 0.65  0.74  CALCIUM 8.0* 10.0  MG -- 2.2  PHOS -- 2.4   Intake/Output      09/21 0701 - 09/22 0700 09/22 0701 - 09/23 0700   P.O. 120    I.V. (mL/kg) 3525 (50.5)    IV Piggyback 155    Total Intake(mL/kg) 3800 (54.4)    Urine (mL/kg/hr) 1395 (0.8)    Drains 55    Blood 250    Total Output 1700    Net +2100          Foley:  9/21>>  A: No active issues. P:   F/u BMET 9/23  GASTROINTESTINAL  Lab 10/19/11 1652  AST 21  ALT 22  ALKPHOS 63  BILITOT 0.5  PROT 7.0  ALBUMIN 4.4    A: Nutrition. P:   Advance diet as tolerated  HEMATOLOGIC  Lab 10/20/11 0800 10/19/11 1652  HGB 11.8* 15.1  HCT 34.2* 43.3  PLT 215 266  INR -- 1.04  APTT -- 35   A: Anemia>>likely dilutional. P:  F/u CBC 9/23  INFECTIOUS  Lab 10/20/11 0800 10/19/11 1652  WBC 8.5 6.8  PROCALCITON -- --   Cultures: None  Antibiotics: Ancef 9/21>>  A: Neurosurgery prophylaxis. P:  Continue ancef per neurosurgery  ENDOCRINE  A: No active issues.  NEUROLOGIC  A: SDH s/p craniotomy 9/21. Hx of ETOH. P:   Post-op care per neurosurgery Continue thiamine, folic acid, MVI Keppra for seizure prophylaxis>>change to po  Ativan PRN for symptoms of ETOH withdrawal (if CIWA > 8) Prn analgesics Advance activity when okay with neurosurgery  BEST PRACTICE / DISPOSITION Level of Care: ICU Primary Service:  PCCM Consultants:  Neurosurgery ([place]) Code Status:  Full Diet:  Heart healthy DVT Px:  SCD GI Px:  Not indicated  Coralyn Helling, MD [facility] Pulmonary/Critical Care 10/20/2011, 9:25 AM Pager:  [phone] After 3pm call: [phone]

## 2011-10-20 NOTE — Progress Notes (Signed)
Subjective: Patient reports Feels fair complains of head pain on left side of head from craniotomy  Objective: Vital signs in last 24 hours: Temp:  [97.6 F (36.4 C)-98.8 F (37.1 C)] 98 F (36.7 C) (09/22 0400) Pulse Rate:  [66-88] 77  (09/22 0700) Resp:  [8-20] 17  (09/22 0700) BP: (97-151)/(58-85) 128/74 mmHg (09/22 0700) SpO2:  [98 %-100 %] 100 % (09/22 0700) Arterial Line BP: (103-139)/(53-70) 136/65 mmHg (09/22 0700) Weight:  [63.1 kg (139 lb 1.8 oz)-69.8 kg (153 lb 14.1 oz)] 69.8 kg (153 lb 14.1 oz) (09/22 0400)  Intake/Output from previous day: 09/21 0701 - 09/22 0700 In: 3800 [P.O.:120; I.V.:3525; IV Piggyback:155] Out: 1700 [Urine:1395; Drains:55; Blood:250] Intake/Output this shift:    Drain with moderate output no evidence of a drift motor function and speech appear to be intact  Lab Results:  Basename 10/20/11 0800 10/19/11 1652  WBC 8.5 6.8  HGB 11.8* 15.1  HCT 34.2* 43.3  PLT 215 266   BMET  Basename 10/20/11 0800 10/19/11 1652  NA 137 138  K 3.8 4.0  CL 105 100  CO2 22 25  GLUCOSE 108* 98  BUN 7 9  CREATININE 0.65 0.74  CALCIUM 8.0* 10.0    Studies/Results: No results found.  Assessment/Plan: Stable postop day 1  LOS: 1 day  CT of head this morning drain remains uncharged   Ethal Gotay J 10/20/2011, 10:03 AM

## 2011-10-21 ENCOUNTER — Inpatient Hospital Stay (HOSPITAL_COMMUNITY): Payer: BC Managed Care – PPO

## 2011-10-21 LAB — CBC
Hemoglobin: 12.5 g/dL — ABNORMAL LOW (ref 13.0–17.0)
MCH: 31.3 pg (ref 26.0–34.0)
Platelets: 219 10*3/uL (ref 150–400)
RBC: 3.99 MIL/uL — ABNORMAL LOW (ref 4.22–5.81)
WBC: 7.4 10*3/uL (ref 4.0–10.5)

## 2011-10-21 LAB — BASIC METABOLIC PANEL
GFR calc Af Amer: >90 mL/min (ref >90)
GFR calc non Af Amer: >90 mL/min (ref >90)
Glucose, Bld: 106 mg/dL — ABNORMAL HIGH (ref 70–99)
Potassium: 3.8 mEq/L (ref 3.5–5.1)
Sodium: 140 mEq/L (ref 135–145)

## 2011-10-21 NOTE — Progress Notes (Signed)
Name: Richard Kramer MRN: [id number] DOB: 1951/01/18    LOS: 2  Referring Provider:  Danielle Dess Reason for Referral:  Subdural hematoma  PULMONARY / CRITICAL CARE MEDICINE  HPI: 61 yo former smoker present to [place] with increasing headaches for several weeks associated with Rt leg weakness and unsteady gait.  MRI brain showed large Lt frotonparietal SDH with mid-line shift.  Pt transferred to [facility] 10/19/2011 for neurosurgical evaluation, and had craniotomy with hematoma evacuation 9/21.  PCCM asked to admit because pt is in ICU. PMHx HTN, ETOH, Hyperlipidemia  Events Since Admission: 9/21 Craniotomy  Current Status: Headache improved with pain meds.  Denies chest pain, abd pain, or dyspnea.  Denies any hx of ETOH withdrawal symptoms or ETOH related seizures.  Vital Signs: Temp:  [98 F (36.7 C)-98.9 F (37.2 C)] 98.3 F (36.8 C) (09/23 0700) Pulse Rate:  [59-96] 96  (09/23 0800) Resp:  [10-21] 14  (09/23 0800) BP: (100-127)/(54-93) 100/68 mmHg (09/23 0800) SpO2:  [93 %-100 %] 97 % (09/23 0800) Arterial Line BP: (121-155)/(60-67) 151/67 mmHg (09/22 1200) Weight:  [65.6 kg (144 lb 10 oz)] 65.6 kg (144 lb 10 oz) (09/23 0400)  Physical Examination: General - no distress HEENT - Wound dressing clean, pupils reactice Cardiac - s1s2 regular Chest - no wheeze Abd - soft, non-tender Ext - no edema Neuro - Awake, alert, follows commands, normal strength Psych - normal mood, behavior   ASSESSMENT AND PLAN  PULMONARY  Lab 10/19/11 1700  PHART 7.458*  PCO2ART 33.2*  PO2ART 80.8  HCO3 23.2  O2SAT 96.8   A: Hx of smoking. P:   Bronchial hygiene Oxygen as needed to keep SpO2 > 92%  CARDIOVASCULAR  Lines:  Rt radial aline 9/21>>9/22  A: Hx of HTN, hyperlipidemia. P:  Hold ASA in setting of SDH Continue lisinopril, simvastatin  RENAL  Lab 10/21/11 0508 10/20/11 0800 10/19/11 1652  NA 140 137 138  K 3.8 3.8 --  CL 104 105 100  CO2 27 22 25   BUN 6 7 9   CREATININE  0.75 0.65 0.74  CALCIUM 9.1 8.0* 10.0  MG -- -- 2.2  PHOS -- -- 2.4   Intake/Output      09/22 0701 - 09/23 0700 09/23 0701 - 09/24 0700   P.O. 840    I.V. (mL/kg) 750 (11.4)    IV Piggyback 50    Total Intake(mL/kg) 1640 (25)    Urine (mL/kg/hr) 2540 (1.6) 500   Drains 190    Blood     Total Output 2730 500   Net -1090 -500         Foley:  9/21>>  A: No active issues. P:   F/u BMET 9/23  GASTROINTESTINAL  Lab 10/19/11 1652  AST 21  ALT 22  ALKPHOS 63  BILITOT 0.5  PROT 7.0  ALBUMIN 4.4    A: Nutrition. P:   Advance diet as tolerated  HEMATOLOGIC  Lab 10/21/11 0508 10/20/11 0800 10/19/11 1652  HGB 12.5* 11.8* 15.1  HCT 36.8* 34.2* 43.3  PLT 219 215 266  INR -- -- 1.04  APTT -- -- 35   A: Anemia>>likely dilutional. P:  F/u CBC 9/23  INFECTIOUS  Lab 10/21/11 0508 10/20/11 0800 10/19/11 1652  WBC 7.4 8.5 6.8  PROCALCITON -- -- --   Cultures: None  Antibiotics: Ancef 9/21>>  A: Neurosurgery prophylaxis. P:  Continue ancef per neurosurgery  ENDOCRINE  A: No active issues.  NEUROLOGIC  A: SDH s/p craniotomy 9/21. Hx of ETOH. P:  Post-op care per neurosurgery Continue thiamine, folic acid, MVI Keppra for seizure prophylaxis>>change to po Ativan PRN for symptoms of ETOH withdrawal (if CIWA > 8) Prn analgesics Advance activity when okay with neurosurgery Head CT today, ordered and pending.  BEST PRACTICE / DISPOSITION Level of Care: ICU Primary Service:  PCCM Consultants:  Neurosurgery (Elsner) Code Status:  Full Diet:  Heart healthy DVT Px:  SCD GI Px:  Not indicated  Alyson Reedy, M.D. [facility] Pulmonary/Critical Care Medicine. Pager: [phone]. After hours pager: [phone].

## 2011-10-21 NOTE — Progress Notes (Signed)
Patient ID: Richard Kramer, male   DOB: [birth year], 61 y.o.   MRN: [id number] Vital signs are stable. Motor function remains intact without any evidence of a drift. Cognitive function appears to be improved and stable  CT scan this morning demonstrates further resolution of subdural blood. Drain in place.  Plan to maintain drain overnight discontinue in the morning and follow patient clinically thereafter he may be transferred to the floor

## 2011-10-22 ENCOUNTER — Encounter (HOSPITAL_COMMUNITY): Payer: Self-pay | Admitting: Neurological Surgery

## 2011-10-22 LAB — BASIC METABOLIC PANEL
BUN: 10 mg/dL (ref 6–23)
CO2: 27 mEq/L (ref 19–32)
Chloride: 102 mEq/L (ref 96–112)
Creatinine, Ser: 0.79 mg/dL (ref 0.50–1.35)
Glucose, Bld: 105 mg/dL — ABNORMAL HIGH (ref 70–99)
Potassium: 3.7 mEq/L (ref 3.5–5.1)

## 2011-10-22 LAB — CBC
HCT: 36.8 % — ABNORMAL LOW (ref 39.0–52.0)
Hemoglobin: 12.3 g/dL — ABNORMAL LOW (ref 13.0–17.0)
MCV: 93.4 fL (ref 78.0–100.0)
RBC: 3.94 MIL/uL — ABNORMAL LOW (ref 4.22–5.81)
WBC: 6 10*3/uL (ref 4.0–10.5)

## 2011-10-22 MED ORDER — PANTOPRAZOLE SODIUM 40 MG PO TBEC
40.0000 mg | DELAYED_RELEASE_TABLET | Freq: Every day | ORAL | Status: DC
  Administered 2011-10-22: 40 mg via ORAL
  Filled 2011-10-22: qty 1

## 2011-10-22 MED ORDER — DOCUSATE SODIUM 100 MG PO CAPS
100.0000 mg | ORAL_CAPSULE | Freq: Two times a day (BID) | ORAL | Status: DC
  Administered 2011-10-22 – 2011-10-23 (×3): 100 mg via ORAL
  Filled 2011-10-22 (×3): qty 1

## 2011-10-22 NOTE — Progress Notes (Signed)
Patient ID: Richard Kramer, male   DOB: [birth year], 61 y.o.   MRN: [id number] Alert. No evidence of a drift. No significant head pain. No headache.  Sub-dural drain removed without difficulty.  Clinically patient is stable. We'll transfer him to 4 N. if he remains stable we'll consider discharge for tomorrow.

## 2011-10-22 NOTE — Progress Notes (Signed)
Pt  61 years old male  with post craniotomy awake ,alert and oriented x 4, transfered from 3100 via w/c to 4N09 room accompanied by wife.. Oriented to room and unit and made comfortable in bed. Initial assessments completed. Vital signs stable and due medication given . Pt denied pain at  this time. Will continue to monitor.

## 2011-10-22 NOTE — Progress Notes (Signed)
Name: Richard Kramer MRN: [id number] DOB: 1950/04/25    LOS: 3  Referring Provider:  Danielle Dess Reason for Referral:  Subdural hematoma  PULMONARY / CRITICAL CARE MEDICINE  HPI: 61 yo former smoker present to [place] with increasing headaches for several weeks associated with Rt leg weakness and unsteady gait.  MRI brain showed large Lt frotonparietal SDH with mid-line shift.  Pt transferred to [facility] 10/19/2011 for neurosurgical evaluation, and had craniotomy with hematoma evacuation 9/21.  PCCM asked to admit because pt is in ICU. PMHx HTN, ETOH, Hyperlipidemia  Events Since Admission: 9/21 Craniotomy  Current Status: Headache improved with pain meds.  Denies chest pain, abd pain, or dyspnea.  Denies any hx of ETOH withdrawal symptoms or ETOH related seizures.  Vital Signs: Temp:  [98.4 F (36.9 C)-98.9 F (37.2 C)] 98.5 F (36.9 C) (09/24 0745) Pulse Rate:  [67-111] 111  (09/24 0700) Resp:  [10-29] 21  (09/24 0700) BP: (97-127)/(62-80) 116/74 mmHg (09/24 0600) SpO2:  [96 %-99 %] 97 % (09/24 0700)  Physical Examination: General - no distress HEENT - Wound dressing clean, pupils reactice Cardiac - s1s2 regular Chest - no wheeze Abd - soft, non-tender Ext - no edema Neuro - Awake, alert, follows commands, normal strength Psych - normal mood, behavior   ASSESSMENT AND PLAN  PULMONARY  Lab 10/19/11 1700  PHART 7.458*  PCO2ART 33.2*  PO2ART 80.8  HCO3 23.2  O2SAT 96.8   A: Hx of smoking. P:   Bronchial hygiene Oxygen as needed to keep SpO2 > 92%  CARDIOVASCULAR  Lines:  Rt radial aline 9/21>>9/22  A: Hx of HTN, hyperlipidemia. P:  Hold ASA in setting of SDH Continue lisinopril, simvastatin  RENAL  Lab 10/22/11 0525 10/21/11 0508 10/20/11 0800 10/19/11 1652  NA 140 140 137 138  K 3.7 3.8 -- --  CL 102 104 105 100  CO2 27 27 22 25   BUN 10 6 7 9   CREATININE 0.79 0.75 0.65 0.74  CALCIUM 9.5 9.1 8.0* 10.0  MG 2.1 -- -- 2.2  PHOS 3.1 -- -- 2.4    Intake/Output      09/23 0701 - 09/24 0700 09/24 0701 - 09/25 0700   P.O. 460    I.V. (mL/kg)     IV Piggyback     Total Intake(mL/kg) 460 (7)    Urine (mL/kg/hr) 2225 (1.4)    Drains 70    Total Output 2295    Net -1835          Foley:  9/21>>  A: No active issues. P:   F/u BMET 9/23  GASTROINTESTINAL  Lab 10/19/11 1652  AST 21  ALT 22  ALKPHOS 63  BILITOT 0.5  PROT 7.0  ALBUMIN 4.4    A: Nutrition. P:   Advance diet as tolerated  HEMATOLOGIC  Lab 10/22/11 0525 10/21/11 0508 10/20/11 0800 10/19/11 1652  HGB 12.3* 12.5* 11.8* 15.1  HCT 36.8* 36.8* 34.2* 43.3  PLT 231 219 215 266  INR -- -- -- 1.04  APTT -- -- -- 35   A: Anemia>>likely dilutional. P:  F/u CBC 9/23  INFECTIOUS  Lab 10/22/11 0525 10/21/11 0508 10/20/11 0800 10/19/11 1652  WBC 6.0 7.4 8.5 6.8  PROCALCITON -- -- -- --   Cultures: None  Antibiotics: Ancef 9/21>>  A: Neurosurgery prophylaxis. P:  Continue ancef per neurosurgery  ENDOCRINE  A: No active issues.  NEUROLOGIC  A: SDH s/p craniotomy 9/21. Hx of ETOH. P:   Post-op care per neurosurgery Continue thiamine,  folic acid, MVI Keppra for seizure prophylaxis>>change to po Ativan PRN for symptoms of ETOH withdrawal (if CIWA > 8). Prn analgesics. Advance activity as tolerated. Head CT noted. Will likely remove drain in AM and if doing ok will be transferred to floor and to [facility] service.  BEST PRACTICE / DISPOSITION Level of Care: ICU Primary Service:  PCCM Consultants:  Neurosurgery (Elsner) Code Status:  Full Diet:  Heart healthy DVT Px:  SCD GI Px:  Not indicated  Alyson Reedy, M.D. [facility] Pulmonary/Critical Care Medicine. Pager: [phone]. After hours pager: [phone].

## 2011-10-22 NOTE — Progress Notes (Signed)
Patient ID: Richard Kramer, male   DOB: Jul 18, 1950, 61 y.o.   MRN: [id number] Stable past removal of subdural drain for transferred to 4 N.

## 2011-10-23 LAB — BASIC METABOLIC PANEL
CO2: 29 mEq/L (ref 19–32)
Calcium: 9.7 mg/dL (ref 8.4–10.5)
Chloride: 100 mEq/L (ref 96–112)
Creatinine, Ser: 0.87 mg/dL (ref 0.50–1.35)
Glucose, Bld: 116 mg/dL — ABNORMAL HIGH (ref 70–99)

## 2011-10-23 LAB — PHOSPHORUS: Phosphorus: 2.8 mg/dL (ref 2.3–4.6)

## 2011-10-23 LAB — CBC
Hemoglobin: 12.2 g/dL — ABNORMAL LOW (ref 13.0–17.0)
MCH: 31.4 pg (ref 26.0–34.0)
MCV: 92.3 fL (ref 78.0–100.0)
RBC: 3.88 MIL/uL — ABNORMAL LOW (ref 4.22–5.81)
WBC: 7.7 10*3/uL (ref 4.0–10.5)

## 2011-10-23 LAB — MAGNESIUM: Magnesium: 2.1 mg/dL (ref 1.5–2.5)

## 2011-10-23 MED ORDER — LEVETIRACETAM 500 MG PO TABS: 500.0000 mg | ORAL_TABLET | Freq: Two times a day (BID) | ORAL | Status: DC

## 2011-10-23 NOTE — Discharge Summary (Signed)
Physician Discharge Summary  Patient ID: Richard Kramer MRN: [id number] DOB/AGE: 1950/04/07 61 y.o.  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 Patient tolerated the surgery well. He had no seizures during his hospital stay. He is discharged home on Keppra 500 mg twice a day. His incision is clean and dry.  Consults: Critical care medicine and trauma and neurosurgery  Significant Diagnostic Studies: MRI brain performed as an outpatient  Treatments: surgery: Left frontoparietal craniotomy and evacuation of acute and chronic subdural hematoma  Discharge Exam: Blood pressure 117/77, pulse 81, temperature 98.1 F (36.7 C), temperature source Oral, resp. rate 18, height 5\' 11"  (1.803 m), weight 65.6 kg (144 lb 10 oz), SpO2 98.00%. Left frontoparietal scalp incision from craniotomy healing well. No evidence of cortical drift. Station and gait are normal. Cranial nerve examination is normal.  Disposition: Discharge home  Discharge Orders    Future Orders Please Complete By Expires   Diet - low sodium heart healthy      Increase activity slowly      Discharge instructions      Comments:   Okay to shower. Do not apply salves or appointments to incision. No heavy lifting with the upper extremities greater than 15 pounds. No driving or operating power equipment.   Call MD for:  redness, tenderness, or signs of infection (pain, swelling, redness, odor or green/yellow discharge around incision  site)      Call MD for:  severe uncontrolled pain      Call MD for:  temperature >100.4          Medication List     As of 10/23/2011  9:08 AM    STOP taking these medications         aspirin 81 MG chewable tablet      TAKE these medications         dicyclomine 10 MG capsule   Commonly known as: BENTYL   Take 10-20 mg by mouth 4 (four) times daily as needed. For IBS      levETIRAcetam 500 MG tablet   Commonly known as: KEPPRA   Take 1 tablet (500 mg total) by mouth every 12 (twelve) hours.      lisinopril 20 MG tablet   Commonly known as: PRINIVIL,ZESTRIL   Take 20 mg by mouth every morning.      OVER THE COUNTER MEDICATION   Take 1 tablet by mouth at bedtime. Medication:Evora plus      sildenafil 100 MG tablet   Commonly known as: VIAGRA   Take 50-100 mg by mouth daily as needed. For ED      simvastatin 40 MG tablet   Commonly known as: ZOCOR   Take 40 mg by mouth at bedtime.         SignedStefani Dama 10/23/2011, 9:08 AM

## 2011-10-23 NOTE — Progress Notes (Addendum)
Name: Richard Kramer MRN: [id number] DOB: 1950/04/26    LOS: 4  Referring Provider:  Danielle Dess Reason for Referral:  Subdural hematoma  PULMONARY / CRITICAL CARE MEDICINE  HPI: 61 yo former smoker present to [place] with increasing headaches for several weeks associated with Rt leg weakness and unsteady gait.  MRI brain showed large Lt frotonparietal SDH with mid-line shift.  Pt transferred to [facility] 10/19/2011 for neurosurgical evaluation, and had craniotomy with hematoma evacuation 9/21.  PCCM asked to admit because pt is in ICU. PMHx HTN, ETOH, Hyperlipidemia  Events Since Admission: 9/21 Craniotomy  Current Status: Headache improved with pain meds.  Denies chest pain, abd pain, or dyspnea.  Denies any hx of ETOH withdrawal symptoms or ETOH related seizures.  Vital Signs: Temp:  [97.6 F (36.4 C)-98.5 F (36.9 C)] 98.1 F (36.7 C) (09/25 0555) Pulse Rate:  [78-112] 81  (09/25 0555) Resp:  [13-25] 18  (09/25 0555) BP: (103-133)/(66-81) 117/77 mmHg (09/25 0555) SpO2:  [97 %-99 %] 98 % (09/25 0555)  Physical Examination: General - no distress HEENT - Wound dressing clean, pupils reactive Cardiac - s1s2 regular Chest - no wheeze Abd - soft, non-tender Ext - no edema Neuro - Awake, alert, follows commands, normal strength Psych - normal mood, behavior   ASSESSMENT AND PLAN  PULMONARY  Lab 10/19/11 1700  PHART 7.458*  PCO2ART 33.2*  PO2ART 80.8  HCO3 23.2  O2SAT 96.8   A: Hx of smoking. P:   Bronchial hygiene. D/C O2.  CARDIOVASCULAR  Lines:  Rt radial aline 9/21>>9/22  A: Hx of HTN, hyperlipidemia. P:  Hold ASA in setting of SDH Continue lisinopril, simvastatin now and with discharge.  RENAL  Lab 10/23/11 0610 10/22/11 0525 10/21/11 0508 10/20/11 0800 10/19/11 1652  NA 140 140 140 137 138  K 3.5 3.7 -- -- --  CL 100 102 104 105 100  CO2 29 27 27 22 25   BUN 8 10 6 7 9   CREATININE 0.87 0.79 0.75 0.65 0.74  CALCIUM 9.7 9.5 9.1 8.0* 10.0  MG 2.1 2.1 -- --  2.2  PHOS 2.8 3.1 -- -- 2.4   Intake/Output      09/24 0701 - 09/25 0700 09/25 0701 - 09/26 0700   P.O. 720    Total Intake(mL/kg) 720 (11)    Urine (mL/kg/hr) 400 (0.3)    Drains 15    Total Output 415    Net +305         Urine Occurrence 2 x     Foley:  9/21>>  A: No active issues. P:   F/u BMET 9/23  GASTROINTESTINAL  Lab 10/19/11 1652  AST 21  ALT 22  ALKPHOS 63  BILITOT 0.5  PROT 7.0  ALBUMIN 4.4    A: Nutrition. P:   Regular diet.  HEMATOLOGIC  Lab 10/23/11 0610 10/22/11 0525 10/21/11 0508 10/20/11 0800 10/19/11 1652  HGB 12.2* 12.3* 12.5* 11.8* 15.1  HCT 35.8* 36.8* 36.8* 34.2* 43.3  PLT 260 231 219 215 266  INR -- -- -- -- 1.04  APTT -- -- -- -- 35   A: Anemia>>likely dilutional. P:  F/u CBC 9/23  INFECTIOUS  Lab 10/23/11 0610 10/22/11 0525 10/21/11 0508 10/20/11 0800 10/19/11 1652  WBC 7.7 6.0 7.4 8.5 6.8  PROCALCITON -- -- -- -- --   Cultures: None  Antibiotics: Ancef 9/21>>  A: Neurosurgery prophylaxis. P:  D/C Abx.  ENDOCRINE  A: No active issues.  NEUROLOGIC  A: SDH s/p craniotomy 9/21. Hx  of ETOH. P:   Post-op care per neurosurgery Continue thiamine, folic acid, MVI Keppra for seizure prophylaxis>>change to po. D/C benzos. D/C analgesics, patient has not needed them. Head CT noted.  Patient ready for D/C per NS, they have already performed discharge planning, will change to Dr. Verlee Rossetti service.  PCCM will sign off, please call back if needed.  Alyson Reedy, M.D. [facility] Pulmonary/Critical Care Medicine. Pager: [phone]. After hours pager: [phone].

## 2011-10-23 NOTE — Care Management Note (Signed)
    Page 1 of 1   10/23/2011     11:46:11 AM   CARE MANAGEMENT NOTE 10/23/2011  Patient:  Richard Kramer, Richard Kramer   Account Number:  [id number]  Date Initiated:  10/21/2011  Documentation initiated by:  Jacquelynn Cree  Subjective/Objective Assessment:   Admitted with SDH, had left frontoparietal crainiotomy.     Action/Plan:   return home   Anticipated DC Date:  10/23/2011   Anticipated DC Plan:  HOME/SELF CARE      DC Planning Services  CM consult      Choice offered to / List presented to:             Status of service:  Completed, signed off Medicare Important Message given?   (If response is "NO", the following Medicare IM given date fields will be blank) Date Medicare IM given:   Date Additional Medicare IM given:    Discharge Disposition:  HOME/SELF CARE  Per UR Regulation:  Reviewed for med. necessity/level of care/duration of stay  If discussed at Long Length of Stay Meetings, dates discussed:    Comments:

## 2011-10-23 NOTE — Progress Notes (Signed)
Pt education and teaching done. Pt stable and ready for discharge home.

## 2011-10-24 ENCOUNTER — Other Ambulatory Visit: Payer: Self-pay | Admitting: Neurosurgery

## 2011-10-28 ENCOUNTER — Other Ambulatory Visit (HOSPITAL_COMMUNITY): Payer: Self-pay | Admitting: Neurological Surgery

## 2011-10-28 DIAGNOSIS — I6200 Nontraumatic subdural hemorrhage, unspecified: Secondary | ICD-10-CM

## 2011-10-29 ENCOUNTER — Ambulatory Visit (HOSPITAL_COMMUNITY)
Admission: RE | Admit: 2011-10-29 | Discharge: 2011-10-29 | Disposition: A | Discharge status: Home / Self Care | Payer: BC Managed Care – PPO | Source: Ambulatory Visit | Attending: Neurological Surgery | Admitting: Neurological Surgery

## 2011-10-29 DIAGNOSIS — I6200 Nontraumatic subdural hemorrhage, unspecified: Secondary | ICD-10-CM

## 2011-11-20 ENCOUNTER — Encounter: Payer: Self-pay | Admitting: *Deleted

## 2011-11-20 ENCOUNTER — Other Ambulatory Visit (HOSPITAL_COMMUNITY): Payer: Self-pay | Admitting: Neurological Surgery

## 2011-11-20 DIAGNOSIS — I6200 Nontraumatic subdural hemorrhage, unspecified: Secondary | ICD-10-CM

## 2011-12-02 ENCOUNTER — Ambulatory Visit (HOSPITAL_COMMUNITY)
Admission: RE | Admit: 2011-12-02 | Discharge: 2011-12-02 | Disposition: A | Discharge status: Home / Self Care | Payer: BC Managed Care – PPO | Source: Ambulatory Visit | Attending: Neurological Surgery | Admitting: Neurological Surgery

## 2011-12-02 DIAGNOSIS — I6200 Nontraumatic subdural hemorrhage, unspecified: Secondary | ICD-10-CM | POA: Insufficient documentation

## 2013-12-28 IMAGING — CT CT HEAD W/O CM
1 of 2 series · 16 of 30 positions shown, 20 images · non-contrast
Comparison: 10/19/2011 MR.

CLINICAL DATA: Post left subdural hematoma drainage.

CT HEAD WITHOUT CONTRAST
TECHNIQUE: Contiguous axial images were obtained from the base of
the skull through the vertex without contrast.

[Series 3: recon 2: brain · axial · 0.47mm/px · z∈[+119,+260]mm · 16 of 64 slices shown, 20 images]
[im 4/64  brain]
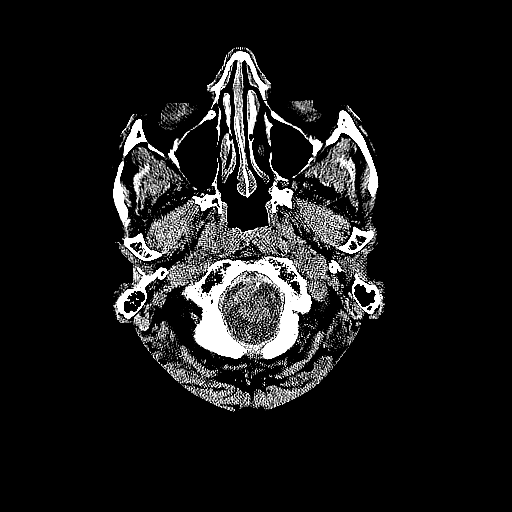
[im 4/64  bone]
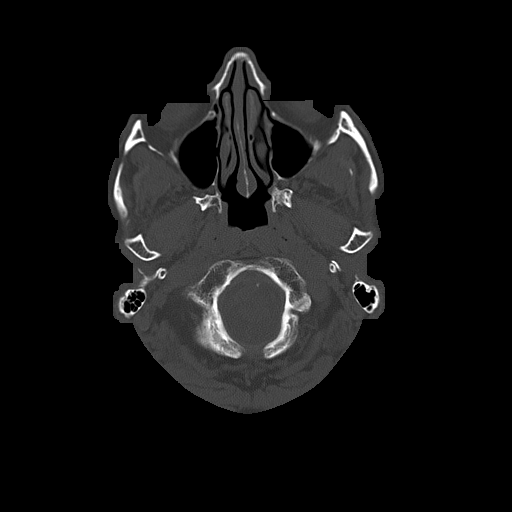
[im 7/64  brain]
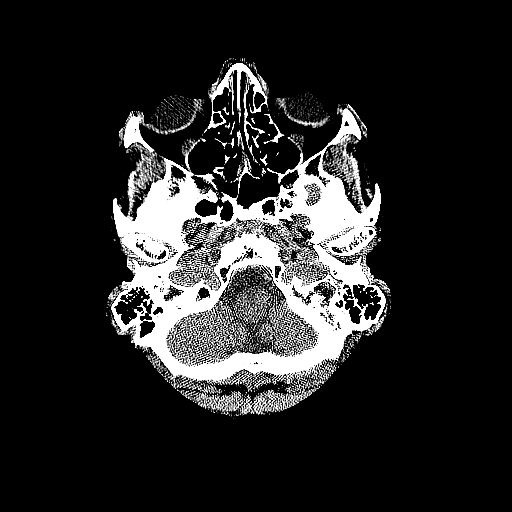
[im 10/64  brain]
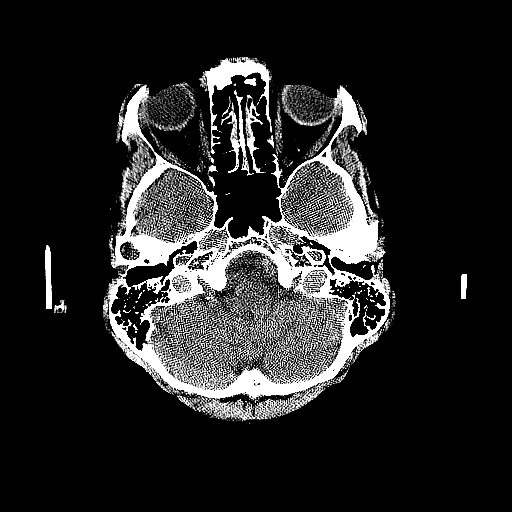
[im 14/64  brain]
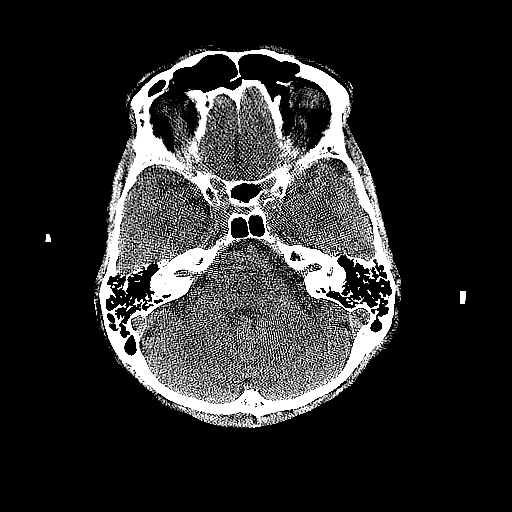
[im 20/64  brain]
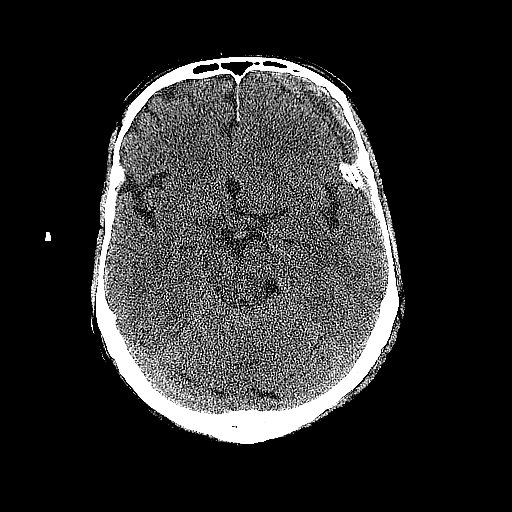
[im 20/64  bone]
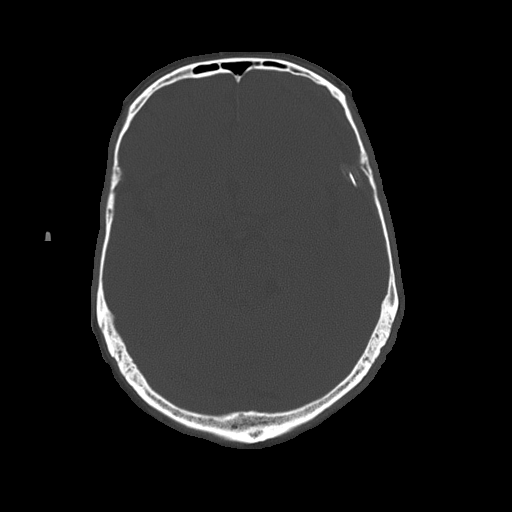
[im 24/64  brain]
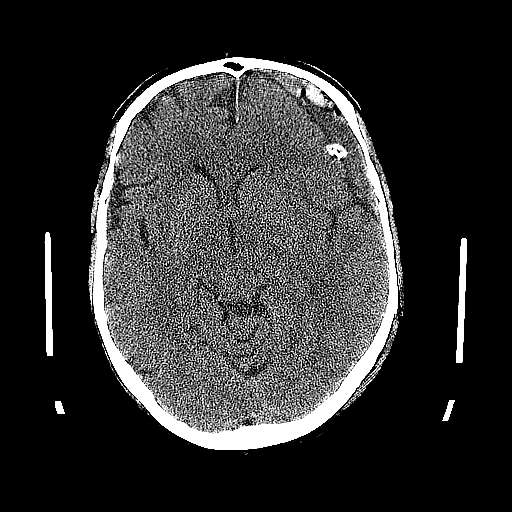
[im 27/64  brain]
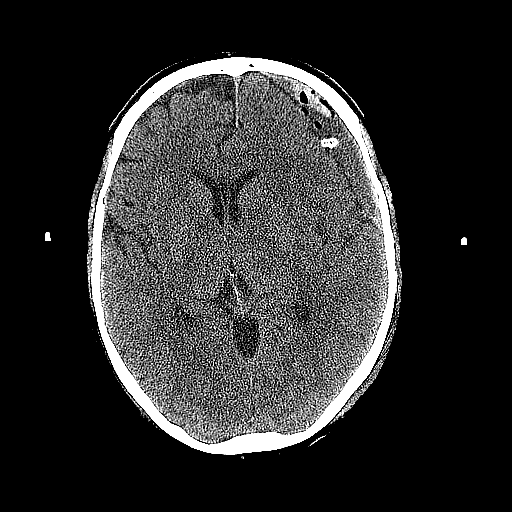
[im 30/64  brain]
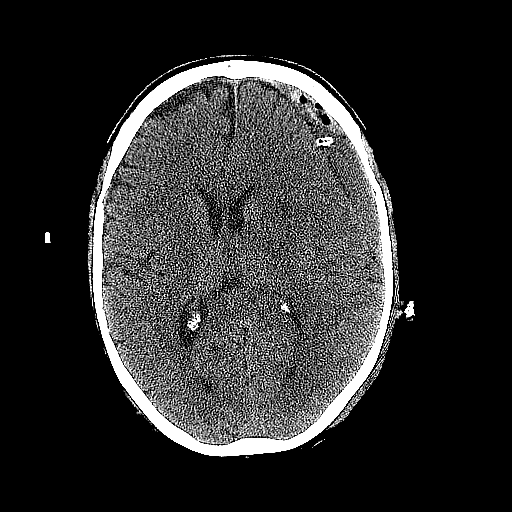
[im 34/64  brain]
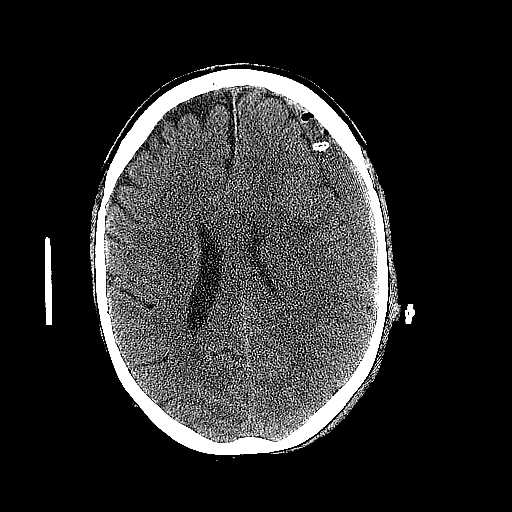
[im 34/64  bone]
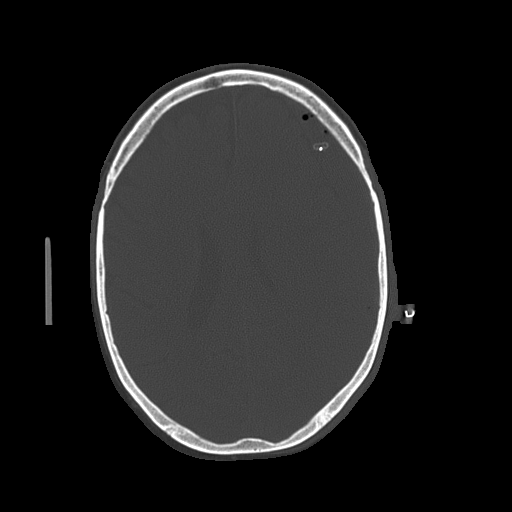
[im 37/64  brain]
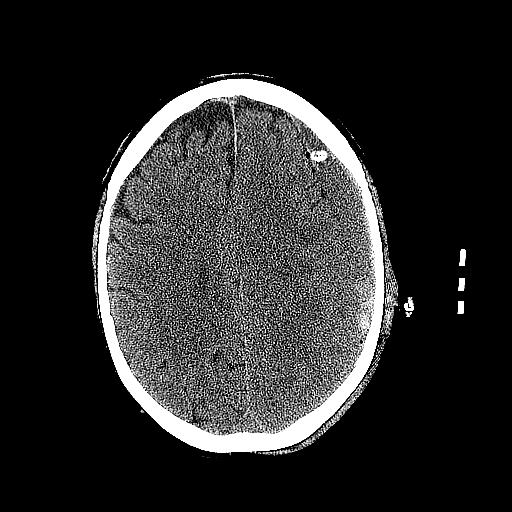
[im 40/64  brain]
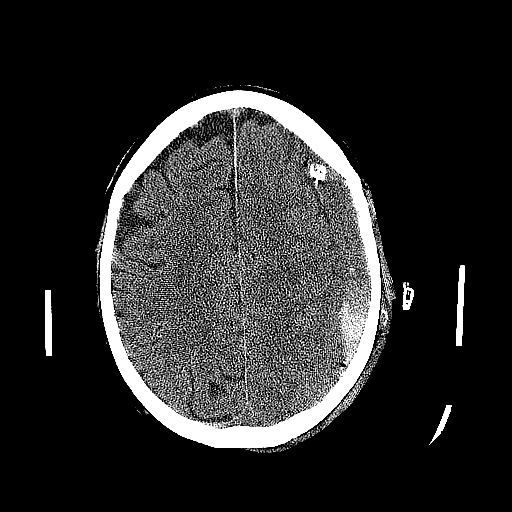
[im 44/64  brain]
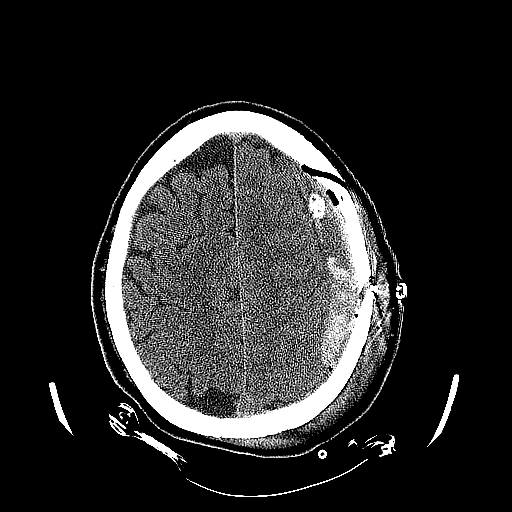
[im 50/64  brain]
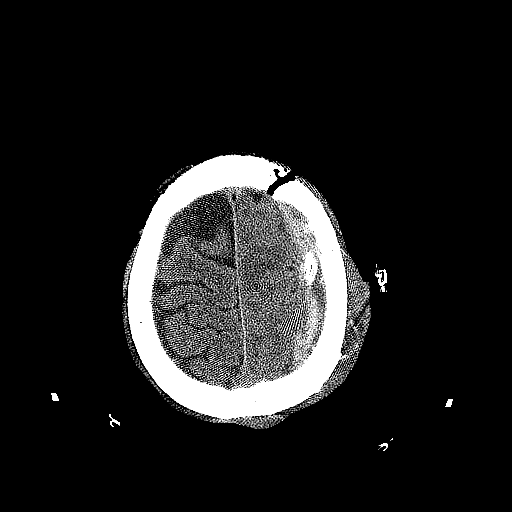
[im 50/64  bone]
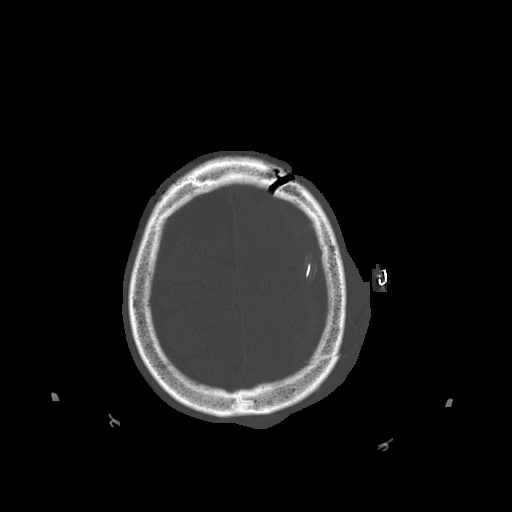
[im 54/64  brain]
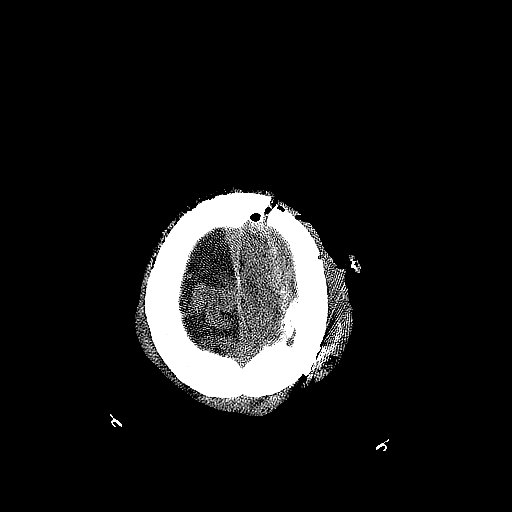
[im 57/64  brain]
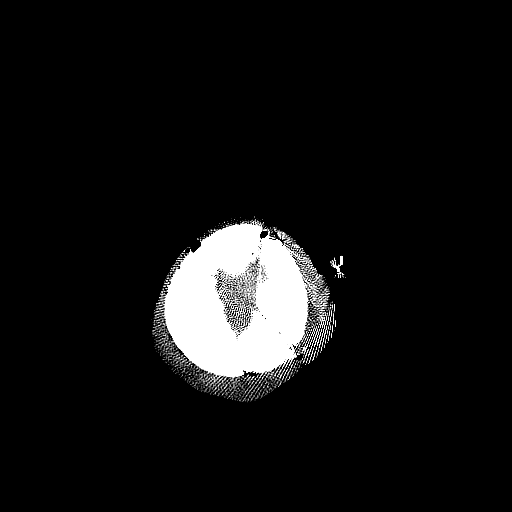
[im 60/64  brain]
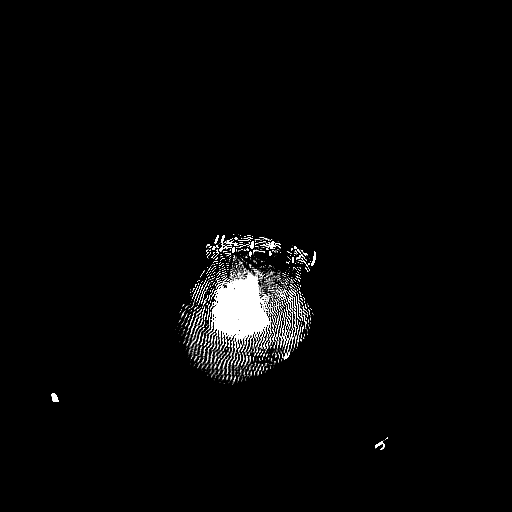

[16 of 30 positions shown; findings below may reference images not displayed]

FINDINGS: Interval left craniotomy for drainage of left-sided
subdural hematoma with drain in place and pneumocephalus noted.
Decrease in size of complex left sided subdural hematoma.  Now with
maximal thickness of 1.6 cm versus prior 2.3 cm.  Decrease in
degree of midline shift to the right now 8 mm versus prior 11.5 mm.

No CT evidence of large acute thrombotic infarct.

Small vessel disease type changes.

No intracranial mass lesions seen separate from the left sided
subdural hematoma.
IMPRESSION: Post surgery for drainage of left-sided subdural collection with
residual left-sided subdural hematoma as detailed above.  Decrease
in degree of midline shift to the right.

## 2013-12-29 IMAGING — CT CT HEAD W/O CM
1 of 2 series · 13 of 30 positions shown, 17 images · non-contrast
Comparison: 10/20/2011

CLINICAL DATA: Status post craniotomy

CT HEAD WITHOUT CONTRAST
TECHNIQUE: Contiguous axial images were obtained from the base of
the skull through the vertex without contrast.

[Series 2: brain · axial · 0.47mm/px · z∈[+162,+294]mm · 13 of 32 slices shown, 17 images]
[im 3/32  brain]
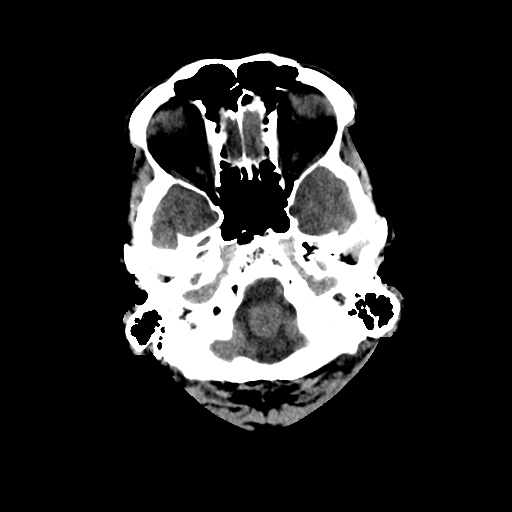
[im 3/32  bone]
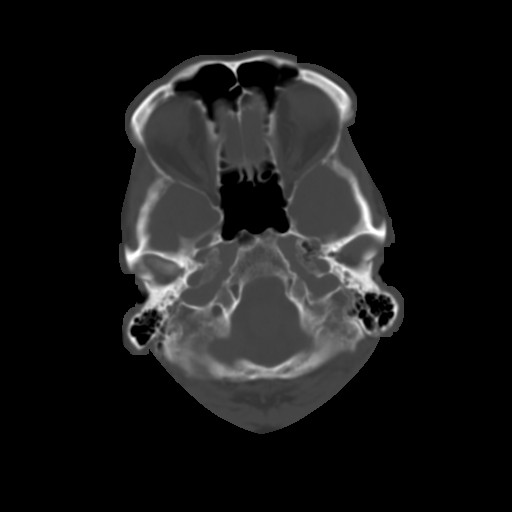
[im 5/32  brain]
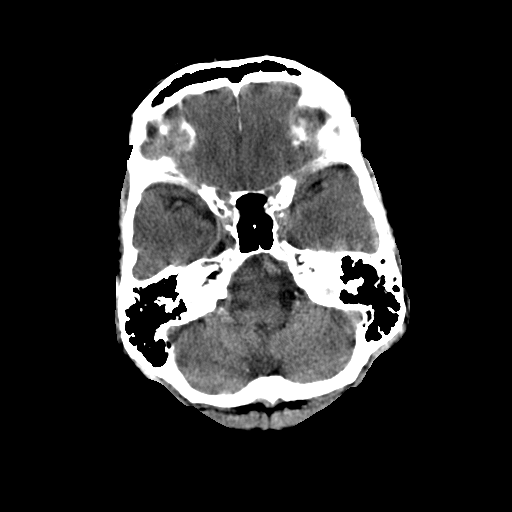
[im 7/32  brain]
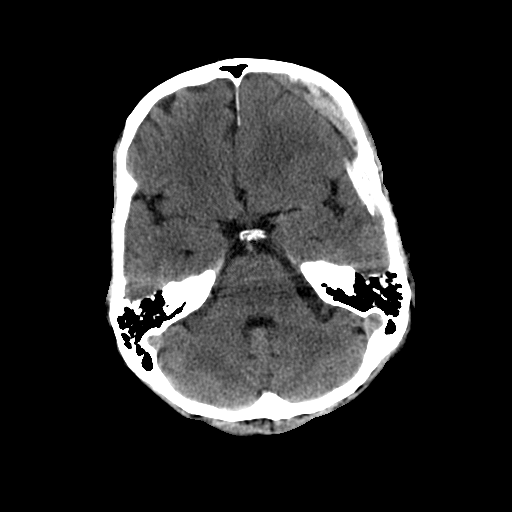
[im 9/32  brain]
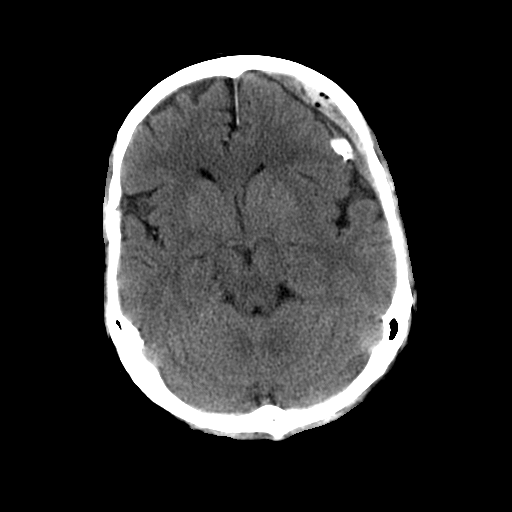
[im 12/32  brain]
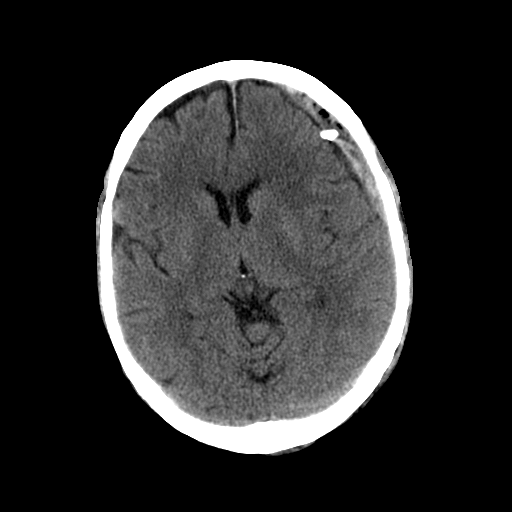
[im 12/32  bone]
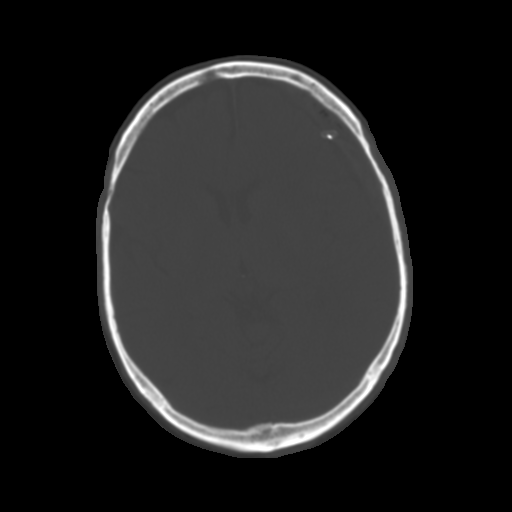
[im 14/32  brain]
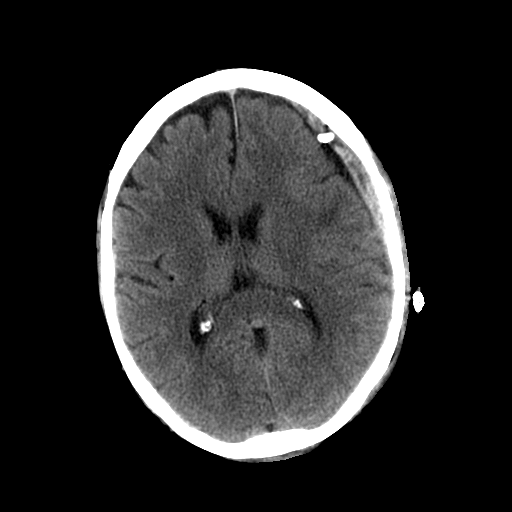
[im 16/32  brain]
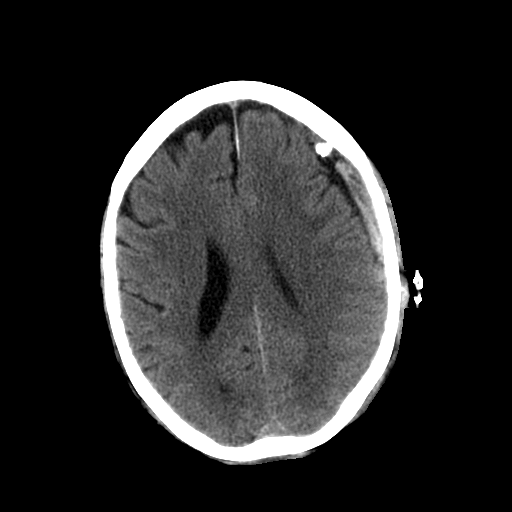
[im 18/32  brain]
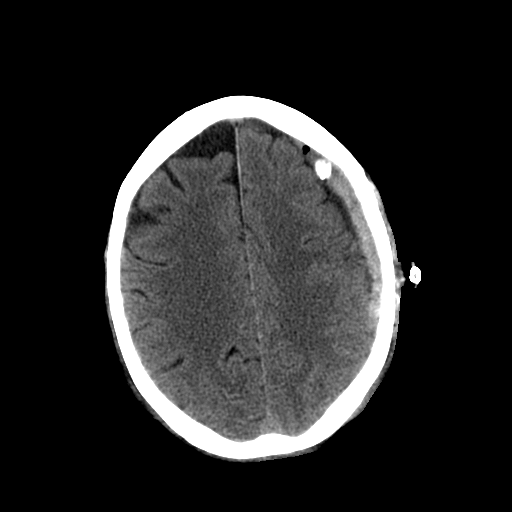
[im 20/32  brain]
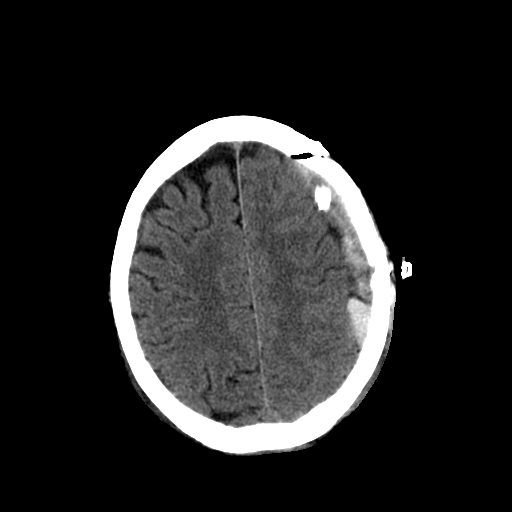
[im 20/32  bone]
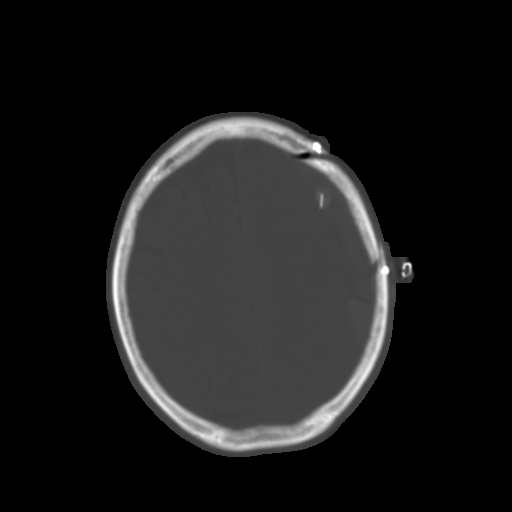
[im 23/32  brain]
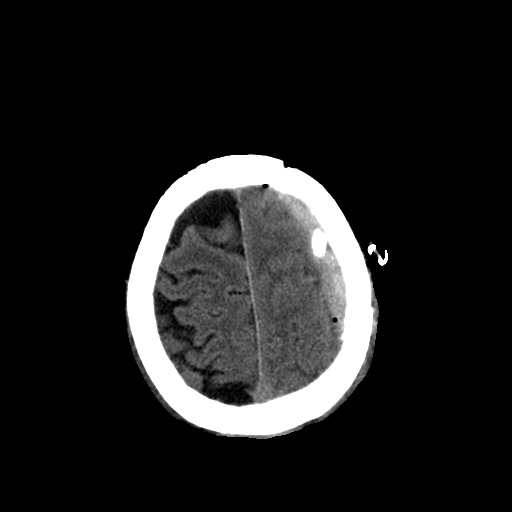
[im 25/32  brain]
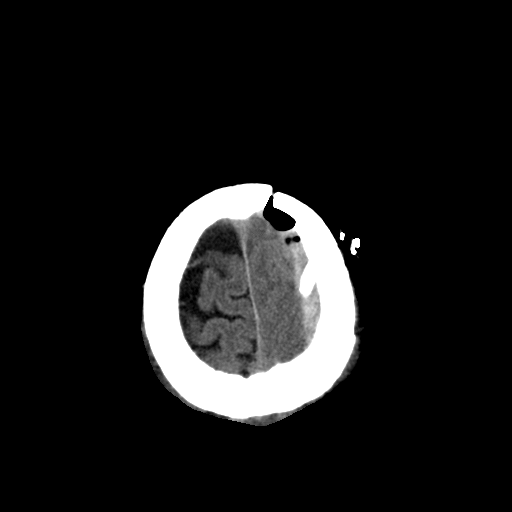
[im 27/32  brain]
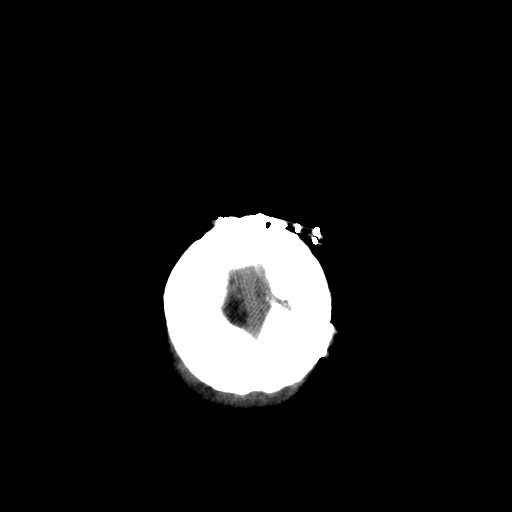
[im 29/32  brain]
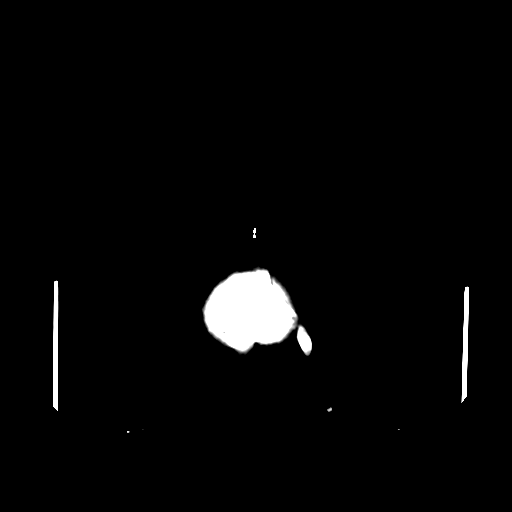
[im 29/32  bone]
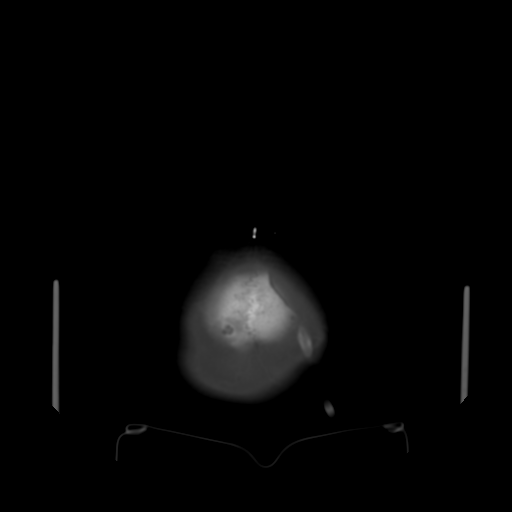

[13 of 30 positions shown; findings below may reference images not displayed]

FINDINGS: Status post left frontal parietal craniotomy. Extra-axial
drain remains in place.  Heterogeneous attenuation extra-axial
collection persists with locules of gas anti dependently.  Overall,
the collection is similar to slightly decreased in size, measuring
1.3 cm in maximal diameter (1.6 cm previously).  There is
persistent sulcal effacement/left hemispheric mass effect with left
to right midline shift of 7 mm, 8 mm previously.  No overt
hydrocephalus.  No definite CT evidence of acute infarction.  No
intraparenchymal hemorrhage.  Atherosclerotic vascular disease.
Paranasal sinuses and mastoid air cells are predominately clear.
IMPRESSION: Status post left craniotomy and extra-axial drain placement. The
extra-axial collection is similar to slightly smaller.  The degree
of midline shift is similar to slightly decreased.

## 2015-05-09 DIAGNOSIS — Z79899 Other long term (current) drug therapy: Secondary | ICD-10-CM | POA: Diagnosis not present

## 2015-05-09 DIAGNOSIS — E785 Hyperlipidemia, unspecified: Secondary | ICD-10-CM | POA: Diagnosis not present

## 2015-05-09 DIAGNOSIS — Z125 Encounter for screening for malignant neoplasm of prostate: Secondary | ICD-10-CM | POA: Diagnosis not present

## 2015-05-11 DIAGNOSIS — Z0000 Encounter for general adult medical examination without abnormal findings: Secondary | ICD-10-CM | POA: Diagnosis not present

## 2015-05-12 DIAGNOSIS — Z23 Encounter for immunization: Secondary | ICD-10-CM | POA: Diagnosis not present

## 2016-01-01 DIAGNOSIS — Z23 Encounter for immunization: Secondary | ICD-10-CM | POA: Diagnosis not present

## 2016-01-01 DIAGNOSIS — N410 Acute prostatitis: Secondary | ICD-10-CM | POA: Diagnosis not present

## 2016-01-11 DIAGNOSIS — Z1211 Encounter for screening for malignant neoplasm of colon: Secondary | ICD-10-CM | POA: Diagnosis not present

## 2016-05-07 DIAGNOSIS — Z0000 Encounter for general adult medical examination without abnormal findings: Secondary | ICD-10-CM | POA: Diagnosis not present

## 2016-05-07 DIAGNOSIS — Z79899 Other long term (current) drug therapy: Secondary | ICD-10-CM | POA: Diagnosis not present

## 2016-05-07 DIAGNOSIS — E785 Hyperlipidemia, unspecified: Secondary | ICD-10-CM | POA: Diagnosis not present

## 2016-05-07 DIAGNOSIS — Z125 Encounter for screening for malignant neoplasm of prostate: Secondary | ICD-10-CM | POA: Diagnosis not present

## 2016-05-17 DIAGNOSIS — E782 Mixed hyperlipidemia: Secondary | ICD-10-CM | POA: Diagnosis not present

## 2016-05-17 DIAGNOSIS — I10 Essential (primary) hypertension: Secondary | ICD-10-CM | POA: Diagnosis not present

## 2016-05-17 DIAGNOSIS — K589 Irritable bowel syndrome without diarrhea: Secondary | ICD-10-CM | POA: Diagnosis not present

## 2016-05-17 DIAGNOSIS — Z0000 Encounter for general adult medical examination without abnormal findings: Secondary | ICD-10-CM | POA: Diagnosis not present

## 2016-05-25 DIAGNOSIS — I10 Essential (primary) hypertension: Secondary | ICD-10-CM | POA: Diagnosis not present

## 2016-05-25 DIAGNOSIS — K760 Fatty (change of) liver, not elsewhere classified: Secondary | ICD-10-CM | POA: Diagnosis not present

## 2016-05-25 DIAGNOSIS — Z79899 Other long term (current) drug therapy: Secondary | ICD-10-CM | POA: Diagnosis not present

## 2016-05-25 DIAGNOSIS — K529 Noninfective gastroenteritis and colitis, unspecified: Secondary | ICD-10-CM | POA: Diagnosis not present

## 2016-05-25 DIAGNOSIS — R1033 Periumbilical pain: Secondary | ICD-10-CM | POA: Diagnosis not present

## 2016-05-25 DIAGNOSIS — R110 Nausea: Secondary | ICD-10-CM | POA: Diagnosis not present

## 2016-05-25 DIAGNOSIS — R197 Diarrhea, unspecified: Secondary | ICD-10-CM | POA: Diagnosis not present

## 2016-05-25 DIAGNOSIS — K2970 Gastritis, unspecified, without bleeding: Secondary | ICD-10-CM | POA: Diagnosis not present

## 2016-06-11 DIAGNOSIS — H2513 Age-related nuclear cataract, bilateral: Secondary | ICD-10-CM | POA: Diagnosis not present

## 2016-06-11 DIAGNOSIS — H524 Presbyopia: Secondary | ICD-10-CM | POA: Diagnosis not present

## 2016-06-11 DIAGNOSIS — H4321 Crystalline deposits in vitreous body, right eye: Secondary | ICD-10-CM | POA: Diagnosis not present

## 2016-11-13 DIAGNOSIS — Z23 Encounter for immunization: Secondary | ICD-10-CM | POA: Diagnosis not present

## 2017-01-26 DIAGNOSIS — J039 Acute tonsillitis, unspecified: Secondary | ICD-10-CM | POA: Diagnosis not present

## 2017-02-08 DIAGNOSIS — J019 Acute sinusitis, unspecified: Secondary | ICD-10-CM | POA: Diagnosis not present

## 2017-04-02 ENCOUNTER — Encounter: Payer: Self-pay | Admitting: Gastroenterology

## 2017-04-15 DIAGNOSIS — Z79899 Other long term (current) drug therapy: Secondary | ICD-10-CM | POA: Diagnosis not present

## 2017-04-15 DIAGNOSIS — E785 Hyperlipidemia, unspecified: Secondary | ICD-10-CM | POA: Diagnosis not present

## 2017-04-22 DIAGNOSIS — Z1339 Encounter for screening examination for other mental health and behavioral disorders: Secondary | ICD-10-CM | POA: Diagnosis not present

## 2017-04-22 DIAGNOSIS — I10 Essential (primary) hypertension: Secondary | ICD-10-CM | POA: Diagnosis not present

## 2017-04-22 DIAGNOSIS — Z6821 Body mass index (BMI) 21.0-21.9, adult: Secondary | ICD-10-CM | POA: Diagnosis not present

## 2017-04-22 DIAGNOSIS — E782 Mixed hyperlipidemia: Secondary | ICD-10-CM | POA: Diagnosis not present

## 2017-05-20 ENCOUNTER — Encounter: Payer: Self-pay | Admitting: Gastroenterology

## 2017-05-20 ENCOUNTER — Ambulatory Visit (INDEPENDENT_AMBULATORY_CARE_PROVIDER_SITE_OTHER): Payer: PPO | Admitting: Gastroenterology

## 2017-05-20 VITALS — BP 130/80 | HR 82 | Ht 71.0 in | Wt 147.0 lb

## 2017-05-20 DIAGNOSIS — Z86010 Personal history of colonic polyps: Secondary | ICD-10-CM | POA: Diagnosis not present

## 2017-05-20 DIAGNOSIS — K580 Irritable bowel syndrome with diarrhea: Secondary | ICD-10-CM

## 2017-05-20 NOTE — Patient Instructions (Signed)
If you are age 67 or older, your body mass index should be between 23-30. Your Body mass index is 20.5 kg/m. If this is out of the aforementioned range listed, please consider follow up with your Primary Care Provider.  If you are age 58 or younger, your body mass index should be between 19-25. Your Body mass index is 20.5 kg/m. If this is out of the aformentioned range listed, please consider follow up with your Primary Care Provider.   You have been scheduled for a colonoscopy. Please follow written instructions given to you at your visit today.  Please pick up your prep supplies at the pharmacy within the next 1-3 days. If you use inhalers (even only as needed), please bring them with you on the day of your procedure. Your physician has requested that you go to www.[email or web address] and enter the access code given to you at your visit today. This web site gives a general overview about your procedure. However, you should still follow specific instructions given to you by our office regarding your preparation for the procedure.  Thank you,  Dr. Jackquline Denmark

## 2017-05-20 NOTE — Progress Notes (Signed)
Chief Complaint: H/O colonic polyps.    ASSESSMENT AND PLAN;   1. H/O Advanced colonic polyps 03/06/2011- was supposed to get rpt colon within 1 year. 2. 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 - colon with miralax -.[facility] from [facility] and [facility].  Proceed with colonoscopy.  I have discussed the risks and benefits.  The risks including risk of perforation requiring laparotomy, bleeding after polypectomy requiring blood transfusions and risks of anesthesia/sedation were discussed.  Rare risks of missing colorectal neoplasms were also discussed.  Alternatives were given.  Patient is fully aware and agrees to proceed. All the questions were answered. Colonoscopy will be scheduled in upcoming days.  Patient is to report immediately if there is any significant weight loss or excessive bleeding until then. Consent forms were given for review.     HPI:   For colon -patient had colonoscopy 03/06/2011 when significant colonic polyps were removed.  He had a large 2.5 cm polyp in the distal ascending colon status post piecemeal polypectomy and a large sigmoid polyp.  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  He was advised to get a repeat colonoscopy performed in 6 months - 1 year.  He was sent letters as well.  Unfortunately, he lost his job and then insurance and did not come for follow-up visit until today.  He has long-standing history of intermittent diarrhea especially after eating.  It started when her mom passed away in 05/14/10.  He describes stool as watery with mucus without nocturnal symptoms.  Most recently in April 2018 he had a bout of gastroenteritis requiring emergency room visit.  He underwent CT scan of the abdomen and pelvis at [facility] which was unremarkable.  His diarrhea did get transiently worse.  His diarrhea is under good control with Bentyl.  No nausea,  vomiting, heartburn, regurgitation, odynophagia or dysphagia.  No significant constipation.  There is no melena or hematochezia. No unintentional weight loss.    Past Medical History:  Diagnosis Date  . Colon polyps 05/14/11  . Elevated cholesterol   . Hypertension   . IBS (irritable bowel syndrome)    with Diarrhea dx by Dr. Nicki Reaper  . Melanoma ([place]) 1979   Right lower back  . Pneumonia 1995    Past Surgical History:  Procedure Laterality Date  . CRANIOTOMY  10/19/2011   Procedure: CRANIOTOMY HEMATOMA EVACUATION SUBDURAL;  Surgeon: Kristeen Miss, MD;  Location: [facility];  Service: Neurosurgery;  Laterality: Left;  Craniotomy for subdural hematoma    Family History  Problem Relation Age of Onset  . Cancer Sister   . Cancer Maternal Aunt   . Cancer Maternal Aunt     Social History   Tobacco Use  . Smoking status: Former Smoker    Packs/day: 1.00    Types: Cigarettes    Last attempt to quit: 10/20/2007    Years since quitting: 9.5  . Smokeless tobacco: Never Used  Substance Use Topics  . Alcohol use: Yes    Alcohol/week: 0.0 oz    Types: 4 - 5 Cans of beer per week  . Drug use: Never    Current Outpatient Medications  Medication Sig Dispense Refill  . acetaminophen (TYLENOL) 325 MG tablet Take 650 mg by mouth every 6 (six) hours as needed.    . calcium citrate-vitamin D (CITRACAL+D) 315-200 MG-UNIT tablet Take 1 tablet by  mouth daily.    Marland Kitchen dicyclomine (BENTYL) 10 MG capsule Take 10-20 mg by mouth 4 (four) times daily as needed. For IBS    . levocetirizine (XYZAL) 5 MG tablet Take 5 mg by mouth daily as needed for allergies.    Marland Kitchen lisinopril (PRINIVIL,ZESTRIL) 20 MG tablet Take 20 mg by mouth every morning.    . Multiple Vitamin (MULTIVITAMIN) tablet Take 1 tablet by mouth daily.    Marland Kitchen OVER THE COUNTER MEDICATION Take 1 tablet by mouth at bedtime. Medication:Evora plus    . Probiotic Product (PROBIOTIC PO) Take by mouth daily.    . rosuvastatin (CRESTOR) 10 MG tablet Take  10 mg by mouth daily.    . sildenafil (VIAGRA) 100 MG tablet Take 50-100 mg by mouth daily as needed. For ED     No current facility-administered medications for this visit.     Allergies  Allergen Reactions  . Penicillins Rash    Review of Systems:  Constitutional: Denies fever, chills, diaphoresis, appetite change and fatigue.  HEENT: Denies photophobia, eye pain, redness, hearing loss, ear pain, congestion, sore throat, rhinorrhea, sneezing, mouth sores, neck pain, neck stiffness and tinnitus.   Respiratory: Denies SOB, DOE, cough, chest tightness,  and wheezing.   Cardiovascular: Denies chest pain, palpitations and leg swelling.  Genitourinary: Denies dysuria, urgency, frequency, hematuria, flank pain and difficulty urinating.  Musculoskeletal: Denies myalgias, back pain, joint swelling, arthralgias and gait problem.  Skin: No rash. .  Neurological: Denies dizziness, seizures, syncope, weakness, light-headedness, numbness and headaches.  Hematological: Denies adenopathy. Easy bruising, personal or family bleeding history  Psychiatric/Behavioral: No anxiety or depression     Physical Exam:    BP 130/80   Pulse 82   Ht 5\' 11"  (1.803 m)   Wt 147 lb (66.7 kg)   SpO2 96%   BMI 20.50 kg/m  Filed Weights   05/20/17 1009  Weight: 147 lb (66.7 kg)   Constitutional:  Well-developed, in no acute distress. Psychiatric: Normal mood and affect. Behavior is normal. HEENT: Pupils normal.  Conjunctivae are normal. No scleral icterus. Neck supple.  Cardiovascular: Normal rate, regular rhythm. No edema Pulmonary/chest: Effort normal and breath sounds normal. No wheezing, rales or rhonchi. Abdominal: Soft, nondistended. Nontender. Bowel sounds active throughout. There are no masses palpable. No hepatomegaly. Rectal:  defered  Neurological: Alert and oriented to person place and time. Skin: Skin is warm and dry. No rashes noted.   Radiology Studies: No results found.    Carmell Austria, MD

## 2017-05-28 ENCOUNTER — Ambulatory Visit (AMBULATORY_SURGERY_CENTER): Payer: PPO | Admitting: Gastroenterology

## 2017-05-28 ENCOUNTER — Encounter: Payer: Self-pay | Admitting: Gastroenterology

## 2017-05-28 ENCOUNTER — Other Ambulatory Visit: Payer: Self-pay

## 2017-05-28 VITALS — BP 130/84 | HR 85 | Temp 98.4°F | Resp 15 | Ht 71.0 in | Wt 147.0 lb

## 2017-05-28 DIAGNOSIS — Z1211 Encounter for screening for malignant neoplasm of colon: Secondary | ICD-10-CM | POA: Diagnosis not present

## 2017-05-28 DIAGNOSIS — D128 Benign neoplasm of rectum: Secondary | ICD-10-CM

## 2017-05-28 DIAGNOSIS — Z86010 Personal history of colonic polyps: Secondary | ICD-10-CM

## 2017-05-28 DIAGNOSIS — D125 Benign neoplasm of sigmoid colon: Secondary | ICD-10-CM

## 2017-05-28 HISTORY — PX: COLONOSCOPY: SHX174

## 2017-05-28 MED ORDER — SODIUM CHLORIDE 0.9 % IV SOLN: 500.0000 mL | Freq: Once | INTRAVENOUS | Status: DC

## 2017-05-28 NOTE — Progress Notes (Signed)
A and O x3. Report to RN. Tolerated MAC anesthesia well.

## 2017-05-28 NOTE — Progress Notes (Signed)
Called to room to assist during endoscopic procedure.  Patient ID and intended procedure confirmed with present staff. Received instructions for my participation in the procedure from the performing physician.  

## 2017-05-28 NOTE — Progress Notes (Signed)
May 28, 2017 7 PM  I called the patient and patient's wife Richard Kramer to check on them.  Patient is doing very well.  No bleeding or any abdominal pain.  He has been eating well.  I have told him to get in touch with [place] in case of any problems.

## 2017-05-28 NOTE — Op Note (Signed)
[place] Patient Name: Richard Kramer Procedure Date: 05/28/2017 9:31 AM MRN: [id number] Endoscopist: Jackquline Denmark MD, MD Age: 67 Referring MD:  Date of Birth: 01/17/1951 Gender: Male Account #: [id number] Procedure:                Colonoscopy Indications:              High risk colon cancer surveillance: Personal                            history of colonic polyps 2013, was advised to get                            a repeat colonoscopy performed in 1 year but he did                            not until today. Medicines:                Monitored Anesthesia Care Procedure:                Pre-Anesthesia Assessment:                           - Prior to the procedure, a History and Physical                            was performed, and patient medications and                            allergies were reviewed. The patient is competent.                            The risks and benefits of the procedure and the                            sedation options and risks were discussed with the                            patient. All questions were answered and informed                            consent was obtained. Patient identification and                            proposed procedure were verified by the physician                            in the procedure room. Mental Status Examination:                            alert and oriented. Respiratory Examination: clear                            to auscultation. Prophylactic Antibiotics: The  patient does not require prophylactic antibiotics.                            Prior Anticoagulants: The patient has taken no                            previous anticoagulant or antiplatelet agents. ASA                            Grade Assessment: II - A patient with mild systemic                            disease. After reviewing the risks and benefits,                            the patient was deemed in  satisfactory condition to                            undergo the procedure. The anesthesia plan was to                            use monitored anesthesia care (MAC). Immediately                            prior to administration of medications, the patient                            was re-assessed for adequacy to receive sedatives.                            The heart rate, respiratory rate, oxygen                            saturations, blood pressure, adequacy of pulmonary                            ventilation, and response to care were monitored                            throughout the procedure. The physical status of                            the patient was re-assessed after the procedure.                           After obtaining informed consent, the colonoscope                            was passed under direct vision. Throughout the                            procedure, the patient's blood pressure, pulse, and  oxygen saturations were monitored continuously. The                            Colonoscope was introduced through the anus and                            advanced to the 2 cm into the ileum. The                            colonoscopy was performed without difficulty. The                            patient tolerated the procedure well. The quality                            of the bowel preparation was excellent. Scope In: 9:56:56 AM Scope Out: 10:35:43 AM Scope Withdrawal Time: 0 hours 36 minutes 29 seconds  Total Procedure Duration: 0 hours 38 minutes 47 seconds  Findings:                 A 50 mm polyp with bulbous, frond like head was                            found in the posterior rectum 6 cm from the anal                            verge. The polyp was semi-sessile polyp, frond like                            with a wide base. EMR was performed with retrieval                            of multiple pieces using a jumbo snare. To prevent                             bleeding after mucosal resection, 4 hemostatic                            clips were successfully placed across the broad                            based post polypectomy site. There was no bleeding                            at the end of the procedure. Postprocedure                            photo-documentation is obtained.                           A 8 mm polyp was found in the sigmoid colon. The  polyp was sessile. The polyp was removed with a hot                            snare. Resection and retrieval were complete.                           A 6 mm polyp was found in the sigmoid colon. The                            polyp was sessile. The polyp was removed with a                            cold snare. Resection and retrieval were complete.                           Multiple small-mouthed diverticula were found in                            the sigmoid colon and few is ascending colon. Complications:            No immediate complications. Estimated Blood Loss:     Estimated blood loss: 5 mL. Impression:               - Large rectal polyp status post piecemeal                            polypectomy (EMR) followed by endoscopic clipping                            of the post polypectomy base.                           - Small sigmoid polyps status post polypectomy.                           - Pancolonic diverticulosis predominantly in the                            sigmoid colon. Recommendation:           - Patient has a contact number available for                            emergencies. The signs and symptoms of potential                            delayed complications were discussed with the                            patient. Return to normal activities tomorrow.                            Written discharge instructions were provided to the  patient.                           - Resume previous  diet.                           - No aspirin, ibuprofen, naproxen, or other                            non-steroidal anti-inflammatory drugs for 10 days                            after polyp removal.                           - Await pathology results.                           - Repeat colonoscopy for surveillance based on                            pathology results, likely in 3 months.                           - The findings and recommendations were discussed                            with the patient's family. Due to the size, nature                            and location of the polyp, there is a high chance                            of complications including bleeding and                            perforation. All the contact numbers were given.                            Patient is to promptly get in touch with [place] or come                            to the emergency room in case he starts having                            rectal bleeding or any problems. I will notify the                            physician on-call as well. Jackquline Denmark MD, MD 05/28/2017 11:01:58 AM This report has been signed electronically.

## 2017-05-28 NOTE — Patient Instructions (Signed)
**  Handouts given on polyps, diverticulosis and a clip card**  No NSAIDS, aspirin, ibuprofen, motrin, advil or aleve for 10 days**   YOU HAD AN ENDOSCOPIC PROCEDURE TODAY: Refer to the procedure report and other information in the discharge instructions given to you for any specific questions about what was found during the examination. If this information does not answer your questions, please call [facility] office at [phone] to clarify.   YOU SHOULD EXPECT: Some feelings of bloating in the abdomen. Passage of more gas than usual. Walking can help get rid of the air that was put into your GI tract during the procedure and reduce the bloating. If you had a lower endoscopy (such as a colonoscopy or flexible sigmoidoscopy) you may notice spotting of blood in your stool or on the toilet paper. Some abdominal soreness may be present for a day or two, also.  DIET: Your first meal following the procedure should be a light meal and then it is ok to progress to your normal diet. A half-sandwich or bowl of soup is an example of a good first meal. Heavy or fried foods are harder to digest and may make you feel nauseous or bloated. Drink plenty of fluids but you should avoid alcoholic beverages for 24 hours. If you had a esophageal dilation, please see attached instructions for diet.    ACTIVITY: Your care partner should take you home directly after the procedure. You should plan to take it easy, moving slowly for the rest of the day. You can resume normal activity the day after the procedure however YOU SHOULD NOT DRIVE, use power tools, machinery or perform tasks that involve climbing or major physical exertion for 24 hours (because of the sedation medicines used during the test).   SYMPTOMS TO REPORT IMMEDIATELY: A gastroenterologist can be reached at any hour. Please call [phone]  for any of the following symptoms:  Following lower endoscopy (colonoscopy, flexible sigmoidoscopy) Excessive amounts of  blood in the stool  Significant tenderness, worsening of abdominal pains  Swelling of the abdomen that is new, acute  Fever of 100 or higher    FOLLOW UP:  If any biopsies were taken you will be contacted by phone or by letter within the next 1-3 weeks. Call [phone]  if you have not heard about the biopsies in 3 weeks.  Please also call with any specific questions about appointments or follow up tests.

## 2017-05-29 ENCOUNTER — Telehealth: Payer: Self-pay

## 2017-05-29 NOTE — Telephone Encounter (Signed)
  Follow up Call-  Call back number 05/28/2017  Post procedure Call Back phone  # [phone]  Permission to leave phone message Yes  Some recent data might be hidden     Patient questions:  Do you have a fever, pain , or abdominal swelling? No. Pain Score  0 *  Have you tolerated food without any problems? Yes.    Have you been able to return to your normal activities? Yes.    Do you have any questions about your discharge instructions: Diet   No. Medications  No. Follow up visit  No.  Do you have questions or concerns about your Care? No.  Actions: * If pain score is 4 or above: No action needed, pain <4.  No problems noted per pt. maw

## 2017-06-04 ENCOUNTER — Encounter: Payer: Self-pay | Admitting: Gastroenterology

## 2017-07-22 ENCOUNTER — Encounter: Payer: Self-pay | Admitting: Gastroenterology

## 2017-08-11 DIAGNOSIS — Z125 Encounter for screening for malignant neoplasm of prostate: Secondary | ICD-10-CM | POA: Diagnosis not present

## 2017-08-11 DIAGNOSIS — E782 Mixed hyperlipidemia: Secondary | ICD-10-CM | POA: Diagnosis not present

## 2017-08-11 DIAGNOSIS — Z79899 Other long term (current) drug therapy: Secondary | ICD-10-CM | POA: Diagnosis not present

## 2017-08-14 DIAGNOSIS — H5203 Hypermetropia, bilateral: Secondary | ICD-10-CM | POA: Diagnosis not present

## 2017-08-14 DIAGNOSIS — H524 Presbyopia: Secondary | ICD-10-CM | POA: Diagnosis not present

## 2017-08-27 DIAGNOSIS — E782 Mixed hyperlipidemia: Secondary | ICD-10-CM | POA: Diagnosis not present

## 2017-08-27 DIAGNOSIS — I10 Essential (primary) hypertension: Secondary | ICD-10-CM | POA: Diagnosis not present

## 2017-09-08 ENCOUNTER — Telehealth: Payer: Self-pay

## 2017-09-08 NOTE — Telephone Encounter (Signed)
He was supposed to have a 3 month recheck colon secondary to a very large polyp.  Wife is going to talk with their insurance company to make sure  It is covered.

## 2017-09-09 ENCOUNTER — Encounter: Payer: Self-pay | Admitting: Gastroenterology

## 2017-09-10 NOTE — Telephone Encounter (Signed)
Colonoscopy is scheduled for 10-29-17.

## 2017-10-16 ENCOUNTER — Ambulatory Visit (AMBULATORY_SURGERY_CENTER): Payer: Self-pay | Admitting: *Deleted

## 2017-10-16 ENCOUNTER — Encounter: Payer: Self-pay | Admitting: Gastroenterology

## 2017-10-16 VITALS — Ht 70.0 in | Wt 149.0 lb

## 2017-10-16 DIAGNOSIS — Z86010 Personal history of colonic polyps: Secondary | ICD-10-CM

## 2017-10-16 NOTE — Progress Notes (Signed)
Patient denies any allergies to eggs or soy. Patient denies any problems with anesthesia/sedation. Patient denies any oxygen use at home. Patient denies taking any diet/weight loss medications or blood thinners. EMMI education offered, pt declined. Wife with pt during pv today.

## 2017-10-29 ENCOUNTER — Ambulatory Visit (AMBULATORY_SURGERY_CENTER): Payer: PPO | Admitting: Gastroenterology

## 2017-10-29 ENCOUNTER — Encounter: Payer: Self-pay | Admitting: Gastroenterology

## 2017-10-29 VITALS — BP 122/77 | HR 78 | Temp 99.3°F | Resp 10 | Ht 71.0 in | Wt 147.0 lb

## 2017-10-29 DIAGNOSIS — D128 Benign neoplasm of rectum: Secondary | ICD-10-CM

## 2017-10-29 DIAGNOSIS — D123 Benign neoplasm of transverse colon: Secondary | ICD-10-CM | POA: Diagnosis not present

## 2017-10-29 DIAGNOSIS — Z1211 Encounter for screening for malignant neoplasm of colon: Secondary | ICD-10-CM | POA: Diagnosis not present

## 2017-10-29 DIAGNOSIS — Z860100 Personal history of colon polyps, unspecified: Secondary | ICD-10-CM

## 2017-10-29 DIAGNOSIS — D129 Benign neoplasm of anus and anal canal: Secondary | ICD-10-CM

## 2017-10-29 DIAGNOSIS — K635 Polyp of colon: Secondary | ICD-10-CM

## 2017-10-29 MED ORDER — SODIUM CHLORIDE 0.9 % IV SOLN: 500.0000 mL | Freq: Once | INTRAVENOUS | Status: DC

## 2017-10-29 NOTE — Progress Notes (Signed)
Called to room to assist during endoscopic procedure.  Patient ID and intended procedure confirmed with present staff. Received instructions for my participation in the procedure from the performing physician.  

## 2017-10-29 NOTE — Progress Notes (Signed)
To PACU, VSS. Report to [email or web address] 

## 2017-10-29 NOTE — Patient Instructions (Signed)
Impression/Recommendations:  Polyp handout given to patient. Diverticulosis handout given to patient.  Resume previous diet. Continue present medications.  No aspirin, ibuprofen, naproxen, or other NSAID drugs for 10 days after polyp removal.  Tylenol only until 11/08/2017.  Repeat Flex sig in 3 months to ensure complete removal at [facility].  Return to GI clinic in 8 weeks.  YOU HAD AN ENDOSCOPIC PROCEDURE TODAY AT [facility]:   Refer to the procedure report that was given to you for any specific questions about what was found during the examination.  If the procedure report does not answer your questions, please call your gastroenterologist to clarify.  If you requested that your care partner not be given the details of your procedure findings, then the procedure report has been included in a sealed envelope for you to review at your convenience later.  YOU SHOULD EXPECT: Some feelings of bloating in the abdomen. Passage of more gas than usual.  Walking can help get rid of the air that was put into your GI tract during the procedure and reduce the bloating. If you had a lower endoscopy (such as a colonoscopy or flexible sigmoidoscopy) you may notice spotting of blood in your stool or on the toilet paper. If you underwent a bowel prep for your procedure, you may not have a normal bowel movement for a few days.  Please Note:  You might notice some irritation and congestion in your nose or some drainage.  This is from the oxygen used during your procedure.  There is no need for concern and it should clear up in a day or so.  SYMPTOMS TO REPORT IMMEDIATELY:   Following lower endoscopy (colonoscopy or flexible sigmoidoscopy):  Excessive amounts of blood in the stool  Significant tenderness or worsening of abdominal pains  Swelling of the abdomen that is new, acute  Fever of 100F or higher For urgent or emergent issues, a gastroenterologist can be reached at any hour by  calling [phone].   DIET:  We do recommend a small meal at first, but then you may proceed to your regular diet.  Drink plenty of fluids but you should avoid alcoholic beverages for 24 hours.  ACTIVITY:  You should plan to take it easy for the rest of today and you should NOT DRIVE or use heavy machinery until tomorrow (because of the sedation medicines used during the test).    FOLLOW UP: Our staff will call the number listed on your records the next business day following your procedure to check on you and address any questions or concerns that you may have regarding the information given to you following your procedure. If we do not reach you, we will leave a message.  However, if you are feeling well and you are not experiencing any problems, there is no need to return our call.  We will assume that you have returned to your regular daily activities without incident.  If any biopsies were taken you will be contacted by phone or by letter within the next 1-3 weeks.  Please call us at [phone] if you have not heard about the biopsies in 3 weeks.    SIGNATURES/CONFIDENTIALITY: You and/or your care partner have signed paperwork which will be entered into your electronic medical record.  These signatures attest to the fact that that the information above on your After Visit Summary has been reviewed and is understood.  Full responsibility of the confidentiality of this discharge information lies with  you and/or your care-partner.

## 2017-10-29 NOTE — Op Note (Addendum)
[place] Patient Name: Richard Kramer Procedure Date: 10/29/2017 8:46 AM MRN: [id number] Endoscopist: Jackquline Denmark , MD Age: 67 Referring MD:  Date of Birth: October 05, 1950 Gender: Male Account #: [id number] Procedure:                Colonoscopy Indications:              High risk colon cancer surveillance: Personal                            history of large rectal polyp status post piecemeal                            polypectomy 05/2017. Medicines:                Monitored Anesthesia Care Procedure:                Pre-Anesthesia Assessment:                           - Prior to the procedure, a History and Physical                            was performed, and patient medications and                            allergies were reviewed. The patient's tolerance of                            previous anesthesia was also reviewed. The risks                            and benefits of the procedure and the sedation                            options and risks were discussed with the patient.                            All questions were answered, and informed consent                            was obtained. Prior Anticoagulants: The patient has                            taken no previous anticoagulant or antiplatelet                            agents. ASA Grade Assessment: II - A patient with                            mild systemic disease. After reviewing the risks                            and benefits, the patient was deemed in  satisfactory condition to undergo the procedure.                           After obtaining informed consent, the colonoscope                            was passed under direct vision. Throughout the                            procedure, the patient's blood pressure, pulse, and                            oxygen saturations were monitored continuously. The                            Colonoscope was introduced through the anus  and                            advanced to the 2 cm into the ileum. The                            colonoscopy was performed without difficulty. The                            patient tolerated the procedure well. The quality                            of the bowel preparation was excellent. Scope In: 8:54:07 AM Scope Out: 9:15:59 AM Total Procedure Duration: 0 hours 21 minutes 52 seconds  Findings:                 A 20 mm residual polyp was found in the rectum, 6                            cm from the anal verge. The polyp was sessile. The                            polyp was removed with a piecemeal technique using                            a hot snare. Resection and retrieval were complete.                            Estimated blood loss was minimal. To prevent                            bleeding after the polypectomy, two hemostatic                            clips were successfully placed (MR conditional).                            There was no bleeding at the end of the procedure.  A 6 mm polyp was found in the hepatic flexure. The                            polyp was sessile, removed by cold snare..                            Estimated blood loss: none.                           Multiple small-mouthed diverticula were found in                            the sigmoid colon. Complications:            No immediate complications. Estimated Blood Loss:     Estimated blood loss: none. Impression:               - Residual rectal polyp status post piecemeal                            polypectomy followed by endoscopic clipping.                           - Hepatic flexure polyp status post polypectomy.                           - Diverticulosis in the sigmoid colon. Recommendation:           - Patient has a contact number available for                            emergencies. The signs and symptoms of potential                            delayed complications were  discussed with the                            patient. Return to normal activities tomorrow.                            Written discharge instructions were provided to the                            patient.                           - Resume previous diet.                           - Continue present medications.                           - No aspirin, ibuprofen, naproxen, or other                            non-steroidal anti-inflammatory drugs for 10 days  after polyp removal.                           - Await pathology results.                           - Patient and patient's family do understand that                            there is high risk of post polypectomy bleeding.                            They will promptly get in touch with [place] in case of                            any problems. Have given them my cell phone number                            as well. Repeat FS in 3 months ensure complete                            removal at [facility], with possible APC treatment if                            needed.                           - Return to GI clinic in 8 weeks. Jackquline Denmark, MD 10/29/2017 9:26:12 AM This report has been signed electronically.

## 2017-10-29 NOTE — Progress Notes (Signed)
Pt's states no medical or surgical changes since previsit or office visit. 

## 2017-10-30 ENCOUNTER — Telehealth: Payer: Self-pay

## 2017-10-30 NOTE — Telephone Encounter (Signed)
  Follow up Call-  Call back number 10/29/2017 05/28/2017  Post procedure Call Back phone  # [phone]  Permission to leave phone message Yes Yes  Some recent data might be hidden     Patient questions:  Do you have a fever, pain , or abdominal swelling? No. Pain Score  0 *  Have you tolerated food without any problems? Yes.    Have you been able to return to your normal activities? Yes.    Do you have any questions about your discharge instructions: Diet   No. Medications  No. Follow up visit  No.  Do you have questions or concerns about your Care? No.  Actions: * If pain score is 4 or above: No action needed, pain <4.

## 2017-11-05 ENCOUNTER — Encounter: Payer: Self-pay | Admitting: Gastroenterology

## 2017-12-04 DIAGNOSIS — Z23 Encounter for immunization: Secondary | ICD-10-CM | POA: Diagnosis not present

## 2017-12-23 ENCOUNTER — Encounter: Payer: Self-pay | Admitting: Gastroenterology

## 2017-12-23 ENCOUNTER — Ambulatory Visit (INDEPENDENT_AMBULATORY_CARE_PROVIDER_SITE_OTHER): Payer: PPO | Admitting: Gastroenterology

## 2017-12-23 VITALS — BP 130/72 | HR 89 | Ht 70.0 in | Wt 150.4 lb

## 2017-12-23 DIAGNOSIS — Z86010 Personal history of colonic polyps: Secondary | ICD-10-CM | POA: Diagnosis not present

## 2017-12-23 NOTE — Progress Notes (Signed)
Chief Complaint:   Referring Provider:  Myer Peer, MD      ASSESSMENT AND PLAN;   #1. H/O large rectal polyp status post EMR 10/2 (Bx- TA) Plan: -Repeat flexible sigmoidoscopy with possible APC treatment in January 2020.  I have explained the risks and benefits.  This will be scheduled at [facility].  HPI:    Richard Kramer is a 67 y.o. male  For FU No nausea, vomiting, heartburn, regurgitation, odynophagia or dysphagia.  No significant diarrhea or constipation.  There is no melena or hematochezia. No unintentional weight loss. Accompanied by his wife Getting together with his wife's family at Thanksgiving.  Past Medical History:  Diagnosis Date  . Allergy   . Colon polyps 2013  . Elevated cholesterol   . Hypertension   . IBS (irritable bowel syndrome)    with Diarrhea dx by Dr. Nicki Reaper  . Melanoma ([place]) 1979   Right lower back  . Pneumonia 1995    Past Surgical History:  Procedure Laterality Date  . COLONOSCOPY  05/28/2017  . CRANIOTOMY  10/19/2011   Procedure: CRANIOTOMY HEMATOMA EVACUATION SUBDURAL;  Surgeon: Kristeen Miss, MD;  Location: [facility];  Service: Neurosurgery;  Laterality: Left;  Craniotomy for subdural hematoma  . mole removed from back  1979    Family History  Problem Relation Age of Onset  . Cancer Sister   . Pancreatic cancer Sister   . Cancer Maternal Aunt   . Cancer Maternal Aunt   . Heart disease Mother   . Colon cancer Neg Hx   . Colon polyps Neg Hx   . Esophageal cancer Neg Hx   . Rectal cancer Neg Hx   . Stomach cancer Neg Hx     Social History   Tobacco Use  . Smoking status: Former Smoker    Packs/day: 1.00    Types: Cigarettes    Last attempt to quit: 10/20/2007    Years since quitting: 10.1  . Smokeless tobacco: Never Used  Substance Use Topics  . Alcohol use: Yes    Alcohol/week: 35.0 standard drinks    Types: 35 Cans of beer per week    Comment: 4-5 beers daily per pt  . Drug use: Never     Current Outpatient Medications  Medication Sig Dispense Refill  . acetaminophen (TYLENOL) 325 MG tablet Take 650 mg by mouth every 6 (six) hours as needed.    . calcium citrate-vitamin D (CITRACAL+D) 315-200 MG-UNIT tablet Take 1 tablet by mouth daily.    Marland Kitchen dicyclomine (BENTYL) 10 MG capsule Take 10-20 mg by mouth 4 (four) times daily as needed. For IBS    . levocetirizine (XYZAL) 5 MG tablet Take 5 mg by mouth daily as needed for allergies.    Marland Kitchen lisinopril (PRINIVIL,ZESTRIL) 20 MG tablet Take 20 mg by mouth every morning.    . Multiple Vitamin (MULTIVITAMIN) tablet Take 1 tablet by mouth daily.    Marland Kitchen OVER THE COUNTER MEDICATION Take 1 tablet by mouth at bedtime. Medication:Evora plus    . Probiotic Product (PROBIOTIC PO) Take by mouth daily.    . rosuvastatin (CRESTOR) 10 MG tablet Take 10 mg by mouth daily.    . sildenafil (VIAGRA) 100 MG tablet Take 50-100 mg by mouth daily as needed. For ED     No current facility-administered medications for this visit.     Allergies  Allergen Reactions  . Penicillins Rash    Review of Systems:  Neg  Physical Exam:    BP 130/72   Pulse 89   Ht 5\' 10"  (1.778 m)   Wt 150 lb 6 oz (68.2 kg)   BMI 21.58 kg/m  Filed Weights   12/23/17 1025  Weight: 150 lb 6 oz (68.2 kg)   Constitutional:  Well-developed, in no acute distress. Psychiatric: Normal mood and affect. Behavior is normal. HEENT: Pupils normal.  Conjunctivae are normal. No scleral icterus. Neck supple.  Cardiovascular: Normal rate, regular rhythm. No edema Pulmonary/chest: Effort normal and breath sounds normal. No wheezing, rales or rhonchi. Abdominal: Soft, nondistended. Nontender. Bowel sounds active throughout. There are no masses palpable. No hepatomegaly. Rectal:  defered Neurological: Alert and oriented to person place and time. Skin: Skin is warm and dry. No rashes noted.    Carmell Austria, MD 12/23/2017, 10:51 AM  Cc: Myer Peer, MD

## 2017-12-23 NOTE — Patient Instructions (Signed)
If you are age 67 or older, your body mass index should be between 23-30. Your Body mass index is 21.58 kg/m. If this is out of the aforementioned range listed, please consider follow up with your Primary Care Provider.  If you are age 45 or younger, your body mass index should be between 19-25. Your Body mass index is 21.58 kg/m. If this is out of the aformentioned range listed, please consider follow up with your Primary Care Provider.   Your provider would like for you to have a Flex-Sig. Someone from our office will call you to have this set up in January.   Thank you,  Dr. Jackquline Denmark

## 2018-01-07 ENCOUNTER — Other Ambulatory Visit: Payer: Self-pay

## 2018-01-07 DIAGNOSIS — Z86010 Personal history of colonic polyps: Secondary | ICD-10-CM

## 2018-01-07 DIAGNOSIS — D128 Benign neoplasm of rectum: Secondary | ICD-10-CM

## 2018-01-07 DIAGNOSIS — D129 Benign neoplasm of anus and anal canal: Secondary | ICD-10-CM

## 2018-01-07 DIAGNOSIS — K635 Polyp of colon: Secondary | ICD-10-CM

## 2018-01-07 DIAGNOSIS — D123 Benign neoplasm of transverse colon: Secondary | ICD-10-CM

## 2018-01-07 NOTE — Progress Notes (Unsigned)
amb  

## 2018-01-07 NOTE — Progress Notes (Signed)
Patients scheduled for a Flex Sig on 02/24/18 at [place] at [facility]. I have called patient and left a message for him to return my call. I have mailed instructions to patient.

## 2018-02-23 ENCOUNTER — Telehealth: Payer: Self-pay | Admitting: Gastroenterology

## 2018-02-23 NOTE — Telephone Encounter (Signed)
Pt has an upcoming procedure tomorrow at hospital w/Dr. Lyndel Safe and calling back bc he has concerns and questions. Asking to speak directly with nurse

## 2018-02-23 NOTE — Telephone Encounter (Signed)
Called and spoke with patient's wife-verified DPR- patient's wife was informed of instructions via phone and also given phone numbers [phone] and [phone] to call first thing in the am of procedure to get clarification on instructions for flex sigmoid; Patient's wife verbalized understanding of information/instructions; Patient's wife was advised to call back if questions/concerns arise;

## 2018-02-23 NOTE — Telephone Encounter (Signed)
Pt wife called in with a question about the upcoming procd that is sched for tomorrow for her husband.

## 2018-02-24 ENCOUNTER — Ambulatory Visit (HOSPITAL_COMMUNITY): Payer: PPO | Admitting: Anesthesiology

## 2018-02-24 ENCOUNTER — Encounter (HOSPITAL_COMMUNITY): Payer: Self-pay

## 2018-02-24 ENCOUNTER — Encounter (HOSPITAL_COMMUNITY)
Admission: RE | Disposition: A | Discharge status: Home / Self Care | Payer: Self-pay | Source: Home / Self Care | Attending: Gastroenterology

## 2018-02-24 ENCOUNTER — Other Ambulatory Visit: Payer: Self-pay

## 2018-02-24 ENCOUNTER — Ambulatory Visit (HOSPITAL_COMMUNITY)
Admission: RE | Admit: 2018-02-24 | Discharge: 2018-02-24 | Disposition: A | Discharge status: Home / Self Care | Payer: PPO | Attending: Gastroenterology | Admitting: Gastroenterology

## 2018-02-24 DIAGNOSIS — K621 Rectal polyp: Secondary | ICD-10-CM | POA: Insufficient documentation

## 2018-02-24 DIAGNOSIS — Z87891 Personal history of nicotine dependence: Secondary | ICD-10-CM | POA: Diagnosis not present

## 2018-02-24 DIAGNOSIS — E7800 Pure hypercholesterolemia, unspecified: Secondary | ICD-10-CM | POA: Insufficient documentation

## 2018-02-24 DIAGNOSIS — Z86010 Personal history of colonic polyps: Secondary | ICD-10-CM | POA: Diagnosis not present

## 2018-02-24 DIAGNOSIS — E785 Hyperlipidemia, unspecified: Secondary | ICD-10-CM | POA: Diagnosis not present

## 2018-02-24 DIAGNOSIS — Z09 Encounter for follow-up examination after completed treatment for conditions other than malignant neoplasm: Secondary | ICD-10-CM | POA: Diagnosis present

## 2018-02-24 DIAGNOSIS — Z79899 Other long term (current) drug therapy: Secondary | ICD-10-CM | POA: Insufficient documentation

## 2018-02-24 DIAGNOSIS — I10 Essential (primary) hypertension: Secondary | ICD-10-CM | POA: Insufficient documentation

## 2018-02-24 DIAGNOSIS — K580 Irritable bowel syndrome with diarrhea: Secondary | ICD-10-CM | POA: Insufficient documentation

## 2018-02-24 DIAGNOSIS — Z85820 Personal history of malignant melanoma of skin: Secondary | ICD-10-CM | POA: Diagnosis not present

## 2018-02-24 DIAGNOSIS — Z8719 Personal history of other diseases of the digestive system: Secondary | ICD-10-CM | POA: Diagnosis not present

## 2018-02-24 HISTORY — PX: POLYPECTOMY: SHX5525

## 2018-02-24 HISTORY — PX: FLEXIBLE SIGMOIDOSCOPY: SHX5431

## 2018-02-24 SURGERY — SIGMOIDOSCOPY, FLEXIBLE
Anesthesia: Monitor Anesthesia Care

## 2018-02-24 MED ORDER — PROPOFOL 10 MG/ML IV BOLUS
INTRAVENOUS | Status: DC | PRN
  Administered 2018-02-24: 30 mg via INTRAVENOUS
  Administered 2018-02-24 (×2): 20 mg via INTRAVENOUS
  Administered 2018-02-24: 50 mg via INTRAVENOUS
  Administered 2018-02-24 (×4): 20 mg via INTRAVENOUS

## 2018-02-24 MED ORDER — LACTATED RINGERS IV SOLN
INTRAVENOUS | Status: DC | PRN
  Administered 2018-02-24: 09:00:00 via INTRAVENOUS

## 2018-02-24 MED ORDER — PROPOFOL 500 MG/50ML IV EMUL
INTRAVENOUS | Status: DC | PRN
  Administered 2018-02-24: 50 ug/kg/min via INTRAVENOUS

## 2018-02-24 NOTE — Anesthesia Preprocedure Evaluation (Signed)
Anesthesia Evaluation  Patient identified by MRN, date of birth, ID band Patient awake    Reviewed: Allergy & Precautions, NPO status , Patient's Chart, lab work & pertinent test results  Airway Mallampati: II  TM Distance: >3 FB Neck ROM: Full    Dental no notable dental hx.    Pulmonary former smoker,    Pulmonary exam normal breath sounds clear to auscultation       Cardiovascular hypertension, Pt. on medications Normal cardiovascular exam Rhythm:Regular Rate:Normal     Neuro/Psych PSYCHIATRIC DISORDERS negative neurological ROS     GI/Hepatic (+)     substance abuse  alcohol use, IBS (irritable bowel syndrome)   Endo/Other  negative endocrine ROS  Renal/GU negative Renal ROS     Musculoskeletal negative musculoskeletal ROS (+)   Abdominal   Peds  Hematology HLD   Anesthesia Other Findings h/o large rectal polyps  Reproductive/Obstetrics                             Anesthesia Physical Anesthesia Plan  ASA: III  Anesthesia Plan: MAC   Post-op Pain Management:    Induction: Intravenous  PONV Risk Score and Plan: 1 and Propofol infusion and Treatment may vary due to age or medical condition  Airway Management Planned: Nasal Cannula  Additional Equipment:   Intra-op Plan:   Post-operative Plan:   Informed Consent: I have reviewed the patients History and Physical, chart, labs and discussed the procedure including the risks, benefits and alternatives for the proposed anesthesia with the patient or authorized representative who has indicated his/her understanding and acceptance.     Dental advisory given  Plan Discussed with: CRNA  Anesthesia Plan Comments:         Anesthesia Quick Evaluation

## 2018-02-24 NOTE — Anesthesia Postprocedure Evaluation (Signed)
Anesthesia Post Note  Patient: Richard Kramer  Procedure(s) Performed: FLEXIBLE SIGMOIDOSCOPY (N/A ) POLYPECTOMY SUBMUCOSAL LIFTING INJECTION     Patient location during evaluation: PACU Anesthesia Type: MAC Level of consciousness: awake and alert Pain management: pain level controlled Vital Signs Assessment: post-procedure vital signs reviewed and stable Respiratory status: spontaneous breathing, nonlabored ventilation, respiratory function stable and patient connected to nasal cannula oxygen Cardiovascular status: stable and blood pressure returned to baseline Postop Assessment: no apparent nausea or vomiting Anesthetic complications: no    Last Vitals:  Vitals:   02/24/18 1047 02/24/18 1050  BP: 107/66 104/64  Pulse: 90 88  Resp: (!) 21 19  Temp:    SpO2: 100% 100%    Last Pain:  Vitals:   02/24/18 1050  TempSrc:   PainSc: 0-No pain                 Ryan P Ellender

## 2018-02-24 NOTE — H&P (Signed)
Chief Complaint: H/O colonic polyps.    ASSESSMENT AND PLAN;   1. H/O Advanced colonic polyps  2. 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 - colon with miralax -.[facility] and [facility].  Proceed with FS .  I have discussed the risks and benefits.  The risks including risk of perforation requiring laparotomy, bleeding after polypectomy requiring blood transfusions and risks of anesthesia/sedation were discussed.  Rare risks of missing colorectal neoplasms were also discussed.  Alternatives were given.  Patient is fully aware and agrees to proceed. All the questions were answered. Colonoscopy will be scheduled in upcoming days.  Patient is to report immediately if there is any significant weight loss or excessive bleeding until then. Consent forms were given for review.     HPI:   For colon -patient had colonoscopy 03/06/2011 when significant colonic polyps were removed.  He had a large 2.5 cm polyp in the distal ascending colon status post piecemeal polypectomy and a large sigmoid polyp.  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  He was advised to get a repeat colonoscopy performed in 6 months - 1 year.  He was sent letters as well.  Unfortunately, he lost his job and then insurance and did not come for follow-up visit until today.  He has long-standing history of intermittent diarrhea especially after eating.  It started when her mom passed away in 2010-05-17.  He describes stool as watery with mucus without nocturnal symptoms.  Most recently in April 2018 he had a bout of gastroenteritis requiring emergency room visit.  He underwent CT scan of the abdomen and pelvis at [facility] which was unremarkable.  His diarrhea did get transiently worse.  His diarrhea is under good control with Bentyl.  No nausea, vomiting, heartburn, regurgitation, odynophagia or dysphagia.   No significant constipation.  There is no melena or hematochezia. No unintentional weight loss.        Past Medical History:  Diagnosis Date  . Colon polyps 05/17/2011  . Elevated cholesterol   . Hypertension   . IBS (irritable bowel syndrome)    with Diarrhea dx by Dr. Nicki Reaper  . Melanoma ([place]) 1979   Right lower back  . Pneumonia 1995         Past Surgical History:  Procedure Laterality Date  . CRANIOTOMY  10/19/2011   Procedure: CRANIOTOMY HEMATOMA EVACUATION SUBDURAL;  Surgeon: Kristeen Miss, MD;  Location: [facility];  Service: Neurosurgery;  Laterality: Left;  Craniotomy for subdural hematoma         Family History  Problem Relation Age of Onset  . Cancer Sister   . Cancer Maternal Aunt   . Cancer Maternal Aunt     Social History        Tobacco Use  . Smoking status: Former Smoker    Packs/day: 1.00    Types: Cigarettes    Last attempt to quit: 10/20/2007    Years since quitting: 9.5  . Smokeless tobacco: Never Used  Substance Use Topics  . Alcohol use: Yes    Alcohol/week: 0.0 oz    Types: 4 - 5 Cans of beer per week  . Drug use: Never          Current Outpatient Medications  Medication Sig Dispense Refill  . acetaminophen (TYLENOL) 325 MG tablet Take 650 mg by mouth every 6 (six) hours as needed.    Marland Kitchen  calcium citrate-vitamin D (CITRACAL+D) 315-200 MG-UNIT tablet Take 1 tablet by mouth daily.    Marland Kitchen dicyclomine (BENTYL) 10 MG capsule Take 10-20 mg by mouth 4 (four) times daily as needed. For IBS    . levocetirizine (XYZAL) 5 MG tablet Take 5 mg by mouth daily as needed for allergies.    Marland Kitchen lisinopril (PRINIVIL,ZESTRIL) 20 MG tablet Take 20 mg by mouth every morning.    . Multiple Vitamin (MULTIVITAMIN) tablet Take 1 tablet by mouth daily.    Marland Kitchen OVER THE COUNTER MEDICATION Take 1 tablet by mouth at bedtime. Medication:Evora plus    . Probiotic Product (PROBIOTIC PO) Take by mouth daily.    . rosuvastatin (CRESTOR)  10 MG tablet Take 10 mg by mouth daily.    . sildenafil (VIAGRA) 100 MG tablet Take 50-100 mg by mouth daily as needed. For ED     No current facility-administered medications for this visit.         Allergies  Allergen Reactions  . Penicillins Rash    Review of Systems:  Constitutional: Denies fever, chills, diaphoresis, appetite change and fatigue.  HEENT: Denies photophobia, eye pain, redness, hearing loss, ear pain, congestion, sore throat, rhinorrhea, sneezing, mouth sores, neck pain, neck stiffness and tinnitus.   Respiratory: Denies SOB, DOE, cough, chest tightness,  and wheezing.   Cardiovascular: Denies chest pain, palpitations and leg swelling.  Genitourinary: Denies dysuria, urgency, frequency, hematuria, flank pain and difficulty urinating.  Musculoskeletal: Denies myalgias, back pain, joint swelling, arthralgias and gait problem.  Skin: No rash. .  Neurological: Denies dizziness, seizures, syncope, weakness, light-headedness, numbness and headaches.  Hematological: Denies adenopathy. Easy bruising, personal or family bleeding history  Psychiatric/Behavioral: No anxiety or depression     Physical Exam:    BP 130/80   Pulse 82   Ht 5\' 11"  (1.803 m)   Wt 147 lb (66.7 kg)   SpO2 96%   BMI 20.50 kg/m     Filed Weights   05/20/17 1009  Weight: 147 lb (66.7 kg)   Constitutional:  Well-developed, in no acute distress. Psychiatric: Normal mood and affect. Behavior is normal. HEENT: Pupils normal.  Conjunctivae are normal. No scleral icterus. Neck supple.  Cardiovascular: Normal rate, regular rhythm. No edema Pulmonary/chest: Effort normal and breath sounds normal. No wheezing, rales or rhonchi. Abdominal: Soft, nondistended. Nontender. Bowel sounds active throughout. There are no masses palpable. No hepatomegaly. Rectal:  defered  Neurological: Alert and oriented to person place and time. Skin: Skin is warm and dry. No rashes noted.   Radiology  Studies: ImagingResults  No results found.      Carmell Austria, MD

## 2018-02-24 NOTE — Discharge Instructions (Signed)
Flexible Sigmoidoscopy, Care After This sheet gives you information about how to care for yourself after your procedure. Your health care provider may also give you more specific instructions. If you have problems or questions, contact your health care provider. What can I expect after the procedure? After the procedure, it is common to have:  Abdominal cramping or pain.  Bloating.  A small amount of rectal bleeding if you had a biopsy. Follow these instructions at home:  Take over-the-counter and prescription medicines only as told by your health care provider.  Do not drive for 24 hours if you received a medicine to help you relax (sedative).  Keep all follow-up visits as told by your health care provider. This is important. Contact a health care provider if:  You have abdominal pain or cramping that gets worse or is not helped with medicine.  You continue to have small amounts of rectal bleeding after 24 hours.  You have nausea or vomiting.  You feel weak or dizzy.  You have a fever. Get help right away if:  You pass large blood clots or see a large amount of blood in the toilet after having a bowel movement.  You have nausea or vomiting for more than 24 hours after the procedure. This information is not intended to replace advice given to you by your health care provider. Make sure you discuss any questions you have with your health care provider. Document Released: 01/19/2013 Document Revised: 08/04/2015 Document Reviewed: 04/15/2015 Elsevier Interactive Patient Education  2019 [employer]. No NSAIDs x 10 days

## 2018-02-24 NOTE — Transfer of Care (Signed)
Immediate Anesthesia Transfer of Care Note  Patient: Richard Kramer  Procedure(s) Performed: Procedure(s): FLEXIBLE SIGMOIDOSCOPY (N/A) POLYPECTOMY  Patient Location: PACU  Anesthesia Type:MAC  Level of Consciousness: Patient easily awoken, sedated, comfortable, cooperative, following commands, responds to stimulation.   Airway & Oxygen Therapy: Patient spontaneously breathing, ventilating well, oxygen via simple oxygen mask.  Post-op Assessment: Report given to PACU RN, vital signs reviewed and stable, moving all extremities.   Post vital signs: Reviewed and stable.  Complications: No apparent anesthesia complications Last Vitals:  Vitals Value Taken Time  BP    Temp    Pulse 90 02/24/2018 10:47 AM  Resp 21 02/24/2018 10:47 AM  SpO2 100 % 02/24/2018 10:47 AM  Vitals shown include unvalidated device data.  Last Pain:  Vitals:   02/24/18 0953  TempSrc: Oral  PainSc: 0-No pain         Complications: No apparent anesthesia complications

## 2018-02-24 NOTE — Op Note (Signed)
[facility] Patient Name: Richard Kramer Procedure Date: 02/24/2018 MRN: [id number] Attending MD: Jackquline Denmark , MD Date of Birth: 1950-10-05 CSN: [id number] Age: 68 Admit Type: Outpatient Procedure:                Flexible Sigmoidoscopy Indications:              High risk colon cancer surveillance: History of                            large rectal polyp status post polypectomy                            10/29/2017 followed by endoscopic clipping. Flexible                            sigmoidoscopy is being performed to rule out any                            residual/recurrent polyp. Providers:                Jackquline Denmark, MD, Cleda Daub, RN, Elspeth Cho                            Tech., Technician, Heide Scales, CRNA Referring MD:              Medicines:                Monitored Anesthesia Care Complications:            No immediate complications. Estimated Blood Loss:     Estimated blood loss: none. Procedure:                Pre-Anesthesia Assessment:                           - Prior to the procedure, a History and Physical                            was performed, and patient medications and                            allergies were reviewed. The patient's tolerance of                            previous anesthesia was also reviewed. The risks                            and benefits of the procedure and the sedation                            options and risks were discussed with the patient.                            All questions were answered, and informed consent  was obtained. Prior Anticoagulants: The patient has                            taken no previous anticoagulant or antiplatelet                            agents. ASA Grade Assessment: II - A patient with                            mild systemic disease. After reviewing the risks                            and benefits, the patient was deemed in    satisfactory condition to undergo the procedure.                           After obtaining informed consent, the scope was                            passed under direct vision. The PCF-H190DL                            ([id number]) Olympus pediatric colonscope was                            introduced through the anus and advanced to the the                            descending colon. The flexible sigmoidoscopy was                            accomplished without difficulty. The patient                            tolerated the procedure well. The quality of the                            bowel preparation was good. Scope In: 10:30:28 AM Scope Out: 10:41:20 AM Total Procedure Duration: 0 hours 10 minutes 52 seconds  Findings:      A 8 mm polyp with clip was found in the rectum, 6 cm from the anal       verge. The polyp was sessile. Area was successfully injected with 6 mL       Orise gel for lesion assessment, and this injection appeared to lift the       lesion adequately. The polyp was removed with a hot snare. Resection and       retrieval were complete. Estimated blood loss: none. Impression:               - One 8 mm residual polyp with clip in the rectum                            s/p EMR. Moderate Sedation:      none Recommendation:           -  Discharge patient to home.                           - Await pathology results.                           - Avoid nonsteroidals for the next 10 days.                           - Repeat colonoscopy in 3 years for surveillance. Procedure Code(s):        --- Professional ---                           [phone], Sigmoidoscopy, flexible; with removal of                            tumor(s), polyp(s), or other lesion(s) by snare                            technique                           45335, Sigmoidoscopy, flexible; with directed                            submucosal injection(s), any substance Diagnosis Code(s):        --- Professional ---                            Z86.010, Personal history of colonic polyps                           K62.1, Rectal polyp CPT copyright 2018 American Medical Association. All rights reserved. The codes documented in this report are preliminary and upon coder review may  be revised to meet current compliance requirements. Jackquline Denmark, MD 02/24/2018 10:54:52 AM This report has been signed electronically. Number of Addenda: 0

## 2018-02-24 NOTE — Anesthesia Procedure Notes (Signed)
Procedure Name: MAC Date/Time: 02/24/2018 10:28 AM Performed by: Deliah Boston, CRNA Pre-anesthesia Checklist: Patient identified, Emergency Drugs available, Suction available and Patient being monitored Patient Re-evaluated:Patient Re-evaluated prior to induction Oxygen Delivery Method: Simple face mask Preoxygenation: Pre-oxygenation with 100% oxygen Induction Type: IV induction Placement Confirmation: positive ETCO2 and breath sounds checked- equal and bilateral

## 2018-02-25 ENCOUNTER — Encounter: Payer: Self-pay | Admitting: Gastroenterology

## 2018-02-25 ENCOUNTER — Encounter (HOSPITAL_COMMUNITY): Payer: Self-pay | Admitting: Gastroenterology

## 2018-03-27 ENCOUNTER — Encounter: Payer: Self-pay | Admitting: Gastroenterology

## 2018-05-18 DIAGNOSIS — I10 Essential (primary) hypertension: Secondary | ICD-10-CM | POA: Diagnosis not present

## 2018-05-18 DIAGNOSIS — Z6821 Body mass index (BMI) 21.0-21.9, adult: Secondary | ICD-10-CM | POA: Diagnosis not present

## 2018-05-18 DIAGNOSIS — R739 Hyperglycemia, unspecified: Secondary | ICD-10-CM | POA: Diagnosis not present

## 2018-05-18 DIAGNOSIS — Z1159 Encounter for screening for other viral diseases: Secondary | ICD-10-CM | POA: Diagnosis not present

## 2018-05-18 DIAGNOSIS — N138 Other obstructive and reflux uropathy: Secondary | ICD-10-CM | POA: Diagnosis not present

## 2018-05-18 DIAGNOSIS — E785 Hyperlipidemia, unspecified: Secondary | ICD-10-CM | POA: Diagnosis not present

## 2018-05-18 DIAGNOSIS — K589 Irritable bowel syndrome without diarrhea: Secondary | ICD-10-CM | POA: Diagnosis not present

## 2018-05-18 DIAGNOSIS — Z79899 Other long term (current) drug therapy: Secondary | ICD-10-CM | POA: Diagnosis not present

## 2018-05-18 DIAGNOSIS — J3089 Other allergic rhinitis: Secondary | ICD-10-CM | POA: Diagnosis not present

## 2018-05-18 DIAGNOSIS — N529 Male erectile dysfunction, unspecified: Secondary | ICD-10-CM | POA: Diagnosis not present

## 2018-05-18 DIAGNOSIS — Z0000 Encounter for general adult medical examination without abnormal findings: Secondary | ICD-10-CM | POA: Diagnosis not present

## 2018-05-18 DIAGNOSIS — N401 Enlarged prostate with lower urinary tract symptoms: Secondary | ICD-10-CM | POA: Diagnosis not present

## 2018-08-27 ENCOUNTER — Other Ambulatory Visit: Payer: Self-pay

## 2018-10-20 DIAGNOSIS — Z23 Encounter for immunization: Secondary | ICD-10-CM | POA: Diagnosis not present

## 2018-11-13 DIAGNOSIS — Z125 Encounter for screening for malignant neoplasm of prostate: Secondary | ICD-10-CM | POA: Diagnosis not present

## 2018-11-13 DIAGNOSIS — E785 Hyperlipidemia, unspecified: Secondary | ICD-10-CM | POA: Diagnosis not present

## 2018-11-13 DIAGNOSIS — Z79899 Other long term (current) drug therapy: Secondary | ICD-10-CM | POA: Diagnosis not present

## 2018-11-20 DIAGNOSIS — N529 Male erectile dysfunction, unspecified: Secondary | ICD-10-CM | POA: Diagnosis not present

## 2018-11-20 DIAGNOSIS — K589 Irritable bowel syndrome without diarrhea: Secondary | ICD-10-CM | POA: Diagnosis not present

## 2018-11-20 DIAGNOSIS — N401 Enlarged prostate with lower urinary tract symptoms: Secondary | ICD-10-CM | POA: Diagnosis not present

## 2018-11-20 DIAGNOSIS — E785 Hyperlipidemia, unspecified: Secondary | ICD-10-CM | POA: Diagnosis not present

## 2018-11-20 DIAGNOSIS — J3089 Other allergic rhinitis: Secondary | ICD-10-CM | POA: Diagnosis not present

## 2018-11-20 DIAGNOSIS — I10 Essential (primary) hypertension: Secondary | ICD-10-CM | POA: Diagnosis not present

## 2018-11-20 DIAGNOSIS — Z6821 Body mass index (BMI) 21.0-21.9, adult: Secondary | ICD-10-CM | POA: Diagnosis not present

## 2018-11-20 DIAGNOSIS — S50812A Abrasion of left forearm, initial encounter: Secondary | ICD-10-CM | POA: Diagnosis not present

## 2018-11-20 DIAGNOSIS — N138 Other obstructive and reflux uropathy: Secondary | ICD-10-CM | POA: Diagnosis not present

## 2019-05-18 DIAGNOSIS — Z79899 Other long term (current) drug therapy: Secondary | ICD-10-CM | POA: Diagnosis not present

## 2019-05-18 DIAGNOSIS — Z125 Encounter for screening for malignant neoplasm of prostate: Secondary | ICD-10-CM | POA: Diagnosis not present

## 2019-05-18 DIAGNOSIS — E785 Hyperlipidemia, unspecified: Secondary | ICD-10-CM | POA: Diagnosis not present

## 2019-05-18 DIAGNOSIS — R739 Hyperglycemia, unspecified: Secondary | ICD-10-CM | POA: Diagnosis not present

## 2019-05-18 DIAGNOSIS — I10 Essential (primary) hypertension: Secondary | ICD-10-CM | POA: Diagnosis not present

## 2019-05-28 DIAGNOSIS — E785 Hyperlipidemia, unspecified: Secondary | ICD-10-CM | POA: Diagnosis not present

## 2019-05-28 DIAGNOSIS — N529 Male erectile dysfunction, unspecified: Secondary | ICD-10-CM | POA: Diagnosis not present

## 2019-05-28 DIAGNOSIS — N401 Enlarged prostate with lower urinary tract symptoms: Secondary | ICD-10-CM | POA: Diagnosis not present

## 2019-05-28 DIAGNOSIS — I10 Essential (primary) hypertension: Secondary | ICD-10-CM | POA: Diagnosis not present

## 2019-05-28 DIAGNOSIS — N138 Other obstructive and reflux uropathy: Secondary | ICD-10-CM | POA: Diagnosis not present

## 2019-05-28 DIAGNOSIS — Z0000 Encounter for general adult medical examination without abnormal findings: Secondary | ICD-10-CM | POA: Diagnosis not present

## 2019-05-28 DIAGNOSIS — J3089 Other allergic rhinitis: Secondary | ICD-10-CM | POA: Diagnosis not present

## 2019-05-28 DIAGNOSIS — Z6821 Body mass index (BMI) 21.0-21.9, adult: Secondary | ICD-10-CM | POA: Diagnosis not present

## 2019-05-28 DIAGNOSIS — Z1331 Encounter for screening for depression: Secondary | ICD-10-CM | POA: Diagnosis not present

## 2019-05-28 DIAGNOSIS — K589 Irritable bowel syndrome without diarrhea: Secondary | ICD-10-CM | POA: Diagnosis not present

## 2019-08-24 ENCOUNTER — Other Ambulatory Visit: Payer: Self-pay

## 2019-10-25 DIAGNOSIS — Z23 Encounter for immunization: Secondary | ICD-10-CM | POA: Diagnosis not present

## 2019-11-30 DIAGNOSIS — R739 Hyperglycemia, unspecified: Secondary | ICD-10-CM | POA: Diagnosis not present

## 2019-11-30 DIAGNOSIS — Z79899 Other long term (current) drug therapy: Secondary | ICD-10-CM | POA: Diagnosis not present

## 2019-11-30 DIAGNOSIS — E785 Hyperlipidemia, unspecified: Secondary | ICD-10-CM | POA: Diagnosis not present

## 2019-12-07 DIAGNOSIS — N529 Male erectile dysfunction, unspecified: Secondary | ICD-10-CM | POA: Diagnosis not present

## 2019-12-07 DIAGNOSIS — J3089 Other allergic rhinitis: Secondary | ICD-10-CM | POA: Diagnosis not present

## 2019-12-07 DIAGNOSIS — I10 Essential (primary) hypertension: Secondary | ICD-10-CM | POA: Diagnosis not present

## 2019-12-07 DIAGNOSIS — K582 Mixed irritable bowel syndrome: Secondary | ICD-10-CM | POA: Diagnosis not present

## 2019-12-07 DIAGNOSIS — N138 Other obstructive and reflux uropathy: Secondary | ICD-10-CM | POA: Diagnosis not present

## 2019-12-07 DIAGNOSIS — E785 Hyperlipidemia, unspecified: Secondary | ICD-10-CM | POA: Diagnosis not present

## 2019-12-07 DIAGNOSIS — Z6822 Body mass index (BMI) 22.0-22.9, adult: Secondary | ICD-10-CM | POA: Diagnosis not present

## 2019-12-07 DIAGNOSIS — N401 Enlarged prostate with lower urinary tract symptoms: Secondary | ICD-10-CM | POA: Diagnosis not present

## 2019-12-07 DIAGNOSIS — H6121 Impacted cerumen, right ear: Secondary | ICD-10-CM | POA: Diagnosis not present

## 2019-12-07 DIAGNOSIS — Z9181 History of falling: Secondary | ICD-10-CM | POA: Diagnosis not present

## 2019-12-07 DIAGNOSIS — H6123 Impacted cerumen, bilateral: Secondary | ICD-10-CM | POA: Diagnosis not present

## 2020-02-01 DIAGNOSIS — H524 Presbyopia: Secondary | ICD-10-CM | POA: Diagnosis not present

## 2020-02-01 DIAGNOSIS — H2513 Age-related nuclear cataract, bilateral: Secondary | ICD-10-CM | POA: Diagnosis not present

## 2020-06-05 DIAGNOSIS — I10 Essential (primary) hypertension: Secondary | ICD-10-CM | POA: Diagnosis not present

## 2020-06-05 DIAGNOSIS — N529 Male erectile dysfunction, unspecified: Secondary | ICD-10-CM | POA: Diagnosis not present

## 2020-06-05 DIAGNOSIS — H60393 Other infective otitis externa, bilateral: Secondary | ICD-10-CM | POA: Diagnosis not present

## 2020-06-05 DIAGNOSIS — E785 Hyperlipidemia, unspecified: Secondary | ICD-10-CM | POA: Diagnosis not present

## 2020-06-05 DIAGNOSIS — K582 Mixed irritable bowel syndrome: Secondary | ICD-10-CM | POA: Diagnosis not present

## 2020-06-05 DIAGNOSIS — Z0000 Encounter for general adult medical examination without abnormal findings: Secondary | ICD-10-CM | POA: Diagnosis not present

## 2020-06-05 DIAGNOSIS — N138 Other obstructive and reflux uropathy: Secondary | ICD-10-CM | POA: Diagnosis not present

## 2020-06-05 DIAGNOSIS — J3089 Other allergic rhinitis: Secondary | ICD-10-CM | POA: Diagnosis not present

## 2020-06-05 DIAGNOSIS — N401 Enlarged prostate with lower urinary tract symptoms: Secondary | ICD-10-CM | POA: Diagnosis not present

## 2020-11-08 DIAGNOSIS — Z23 Encounter for immunization: Secondary | ICD-10-CM | POA: Diagnosis not present

## 2020-12-26 ENCOUNTER — Telehealth: Payer: Self-pay

## 2020-12-26 NOTE — Telephone Encounter (Signed)
I have called patient and left a message for patient to call back and schedule an appointment with pre-visit for recall colonoscopy.

## 2021-03-08 DIAGNOSIS — C44219 Basal cell carcinoma of skin of left ear and external auricular canal: Secondary | ICD-10-CM | POA: Diagnosis not present

## 2021-03-08 DIAGNOSIS — L814 Other melanin hyperpigmentation: Secondary | ICD-10-CM | POA: Diagnosis not present

## 2021-03-08 DIAGNOSIS — C44319 Basal cell carcinoma of skin of other parts of face: Secondary | ICD-10-CM | POA: Diagnosis not present

## 2021-03-08 DIAGNOSIS — L578 Other skin changes due to chronic exposure to nonionizing radiation: Secondary | ICD-10-CM | POA: Diagnosis not present

## 2021-03-29 DIAGNOSIS — C44319 Basal cell carcinoma of skin of other parts of face: Secondary | ICD-10-CM | POA: Diagnosis not present

## 2021-03-29 DIAGNOSIS — C44219 Basal cell carcinoma of skin of left ear and external auricular canal: Secondary | ICD-10-CM | POA: Diagnosis not present

## 2021-05-23 DIAGNOSIS — H2513 Age-related nuclear cataract, bilateral: Secondary | ICD-10-CM | POA: Diagnosis not present

## 2021-05-28 ENCOUNTER — Encounter: Payer: Self-pay | Admitting: Gastroenterology

## 2021-06-01 DIAGNOSIS — Z79899 Other long term (current) drug therapy: Secondary | ICD-10-CM | POA: Diagnosis not present

## 2021-06-01 DIAGNOSIS — R739 Hyperglycemia, unspecified: Secondary | ICD-10-CM | POA: Diagnosis not present

## 2021-06-01 DIAGNOSIS — E785 Hyperlipidemia, unspecified: Secondary | ICD-10-CM | POA: Diagnosis not present

## 2021-06-08 DIAGNOSIS — I10 Essential (primary) hypertension: Secondary | ICD-10-CM | POA: Diagnosis not present

## 2021-06-08 DIAGNOSIS — J3089 Other allergic rhinitis: Secondary | ICD-10-CM | POA: Diagnosis not present

## 2021-06-08 DIAGNOSIS — Z0000 Encounter for general adult medical examination without abnormal findings: Secondary | ICD-10-CM | POA: Diagnosis not present

## 2021-06-08 DIAGNOSIS — E785 Hyperlipidemia, unspecified: Secondary | ICD-10-CM | POA: Diagnosis not present

## 2021-06-08 DIAGNOSIS — N5201 Erectile dysfunction due to arterial insufficiency: Secondary | ICD-10-CM | POA: Diagnosis not present

## 2021-06-08 DIAGNOSIS — K582 Mixed irritable bowel syndrome: Secondary | ICD-10-CM | POA: Diagnosis not present

## 2021-06-08 DIAGNOSIS — N401 Enlarged prostate with lower urinary tract symptoms: Secondary | ICD-10-CM | POA: Diagnosis not present

## 2021-06-08 DIAGNOSIS — K2960 Other gastritis without bleeding: Secondary | ICD-10-CM | POA: Diagnosis not present

## 2021-06-08 DIAGNOSIS — N138 Other obstructive and reflux uropathy: Secondary | ICD-10-CM | POA: Diagnosis not present

## 2021-06-26 ENCOUNTER — Ambulatory Visit (AMBULATORY_SURGERY_CENTER): Payer: PPO | Admitting: *Deleted

## 2021-06-26 VITALS — Ht 70.0 in | Wt 148.0 lb

## 2021-06-26 DIAGNOSIS — Z86010 Personal history of colonic polyps: Secondary | ICD-10-CM

## 2021-06-26 NOTE — Progress Notes (Signed)

## 2021-07-13 ENCOUNTER — Encounter: Payer: Self-pay | Admitting: Gastroenterology

## 2021-07-17 ENCOUNTER — Encounter: Payer: Self-pay | Admitting: Gastroenterology

## 2021-07-17 ENCOUNTER — Ambulatory Visit (AMBULATORY_SURGERY_CENTER): Payer: Medicare HMO | Admitting: Gastroenterology

## 2021-07-17 VITALS — BP 123/74 | HR 66 | Temp 97.0°F | Resp 10 | Ht 70.0 in | Wt 148.0 lb

## 2021-07-17 DIAGNOSIS — Z86010 Personal history of colonic polyps: Secondary | ICD-10-CM

## 2021-07-17 DIAGNOSIS — D128 Benign neoplasm of rectum: Secondary | ICD-10-CM | POA: Diagnosis not present

## 2021-07-17 DIAGNOSIS — D122 Benign neoplasm of ascending colon: Secondary | ICD-10-CM

## 2021-07-17 DIAGNOSIS — Z09 Encounter for follow-up examination after completed treatment for conditions other than malignant neoplasm: Secondary | ICD-10-CM | POA: Diagnosis not present

## 2021-07-17 DIAGNOSIS — D123 Benign neoplasm of transverse colon: Secondary | ICD-10-CM | POA: Diagnosis not present

## 2021-07-17 DIAGNOSIS — K621 Rectal polyp: Secondary | ICD-10-CM

## 2021-07-17 MED ORDER — SODIUM CHLORIDE 0.9 % IV SOLN: 500.0000 mL | Freq: Once | INTRAVENOUS | Status: DC

## 2021-07-17 NOTE — Progress Notes (Signed)
Chief Complaint:   Referring Provider:  Jackquline Denmark, MD      ASSESSMENT AND PLAN;   #1. H/O large rectal polyp status post EMR 10/2 (Bx- TA). FS s/p EMR jan 2020 Plan: -colon.  HPI:    Richard Kramer is a 71 y.o. male  For FU No nausea, vomiting, heartburn, regurgitation, odynophagia or dysphagia.  No significant diarrhea or constipation.  There is no melena or hematochezia. No unintentional weight loss. Accompanied by his wife  Past Medical History:  Diagnosis Date   Allergy    seasonal   Cataract    right   Colon polyps 2013   Elevated cholesterol    GERD (gastroesophageal reflux disease)    every now and then   Hypertension    IBS (irritable bowel syndrome)    with Diarrhea dx by Dr. Nicki Reaper   Melanoma [facility]) 1979   Right lower back   Pneumonia 1995    Past Surgical History:  Procedure Laterality Date   COLONOSCOPY  05/28/2017   CRANIOTOMY  10/19/2011   Procedure: CRANIOTOMY HEMATOMA EVACUATION SUBDURAL;  Surgeon: Kristeen Miss, MD;  Location: [facility];  Service: Neurosurgery;  Laterality: Left;  Craniotomy for subdural hematoma   FLEXIBLE SIGMOIDOSCOPY N/A 02/24/2018   Procedure: FLEXIBLE SIGMOIDOSCOPY;  Surgeon: Jackquline Denmark, MD;  Location: [facility] ENDOSCOPY;  Service: Endoscopy;  Laterality: N/A;   MOHS SURGERY     left ear,under left eye   mole removed from back  1979   POLYPECTOMY  02/24/2018   Procedure: POLYPECTOMY;  Surgeon: Jackquline Denmark, MD;  Location: [facility] ENDOSCOPY;  Service: Endoscopy;;   POLYPECTOMY      Family History  Problem Relation Age of Onset   Heart disease Mother    Cancer Sister    Pancreatic cancer Sister    Cancer Maternal Aunt    Cancer Maternal Aunt    Colon cancer Neg Hx    Colon polyps Neg Hx    Esophageal cancer Neg Hx    Rectal cancer Neg Hx    Stomach cancer Neg Hx    Crohn's disease Neg Hx     Social History   Tobacco Use   Smoking status: Former    Packs/day: 1.00    Types: Cigarettes    Quit date:  10/20/2007    Years since quitting: 13.7    Passive exposure: Never   Smokeless tobacco: Never  Vaping Use   Vaping Use: Never used  Substance Use Topics   Alcohol use: Yes    Alcohol/week: 35.0 standard drinks of alcohol    Types: 35 Cans of beer per week    Comment: 4-5 beers daily per pt   Drug use: Never    Current Outpatient Medications  Medication Sig Dispense Refill   acetaminophen (TYLENOL) 325 MG tablet Take 650 mg by mouth every 6 (six) hours as needed for moderate pain or headache.      Calcium Carb-Cholecalciferol (CALCIUM 500+D3 PO) Take by mouth daily. Calcium 500 iu and d3 630 mg     dicyclomine (BENTYL) 10 MG capsule Take 10 mg by mouth 4 (four) times daily as needed for spasms.      lisinopril (PRINIVIL,ZESTRIL) 20 MG tablet Take 20 mg by mouth every morning.     Multiple Vitamin (MULTIVITAMIN) tablet Take 1 tablet by mouth daily.     rosuvastatin (CRESTOR) 10 MG tablet Take 10 mg by mouth daily.     sildenafil (REVATIO) 20 MG tablet Take 40 mg by mouth  daily as needed (for ED).     tamsulosin (FLOMAX) 0.4 MG CAPS capsule Take 0.4 mg by mouth daily.     famotidine (PEPCID) 40 MG tablet Take 40 mg by mouth as needed.     fluticasone (FLONASE) 50 MCG/ACT nasal spray Place into both nostrils as needed.     levocetirizine (XYZAL) 5 MG tablet Take 5 mg by mouth as needed for allergies.     Probiotic Product (PROBIOTIC PO) Take by mouth daily. Take one daily supports oral care.     Current Facility-Administered Medications  Medication Dose Route Frequency Provider Last Rate Last Admin   0.9 %  sodium chloride infusion  500 mL Intravenous Once Jackquline Denmark, MD        Allergies  Allergen Reactions   Penicillins Rash and Other (See Comments)    Did it involve swelling of the face/tongue/throat, SOB, or low BP? No Did it involve sudden or severe rash/hives, skin peeling, or any reaction on the inside of your mouth or nose? No Did you need to seek medical attention at a  hospital or doctor's office? Yes When did it last happen? 40 or more years ago If all above answers are "NO", may proceed with cephalosporin use.     Review of Systems:  Neg     Physical Exam:    BP 140/74   Pulse 90   Temp (!) 97 F (36.1 C) (Temporal)   Ht '5\' 10"'$  (1.778 m)   Wt 148 lb (67.1 kg)   SpO2 97%   BMI 21.24 kg/m  Filed Weights   07/17/21 1051  Weight: 148 lb (67.1 kg)   Constitutional:  Well-developed, in no acute distress. Psychiatric: Normal mood and affect. Behavior is normal. HEENT: Pupils normal.  Conjunctivae are normal. No scleral icterus. Neck supple.  Cardiovascular: Normal rate, regular rhythm. No edema Pulmonary/chest: Effort normal and breath sounds normal. No wheezing, rales or rhonchi. Abdominal: Soft, nondistended. Nontender. Bowel sounds active throughout. There are no masses palpable. No hepatomegaly. Rectal:  defered Neurological: Alert and oriented to person place and time. Skin: Skin is warm and dry. No rashes noted.    Carmell Austria, MD 07/17/2021, 10:57 AM  Cc: Jackquline Denmark, MD

## 2021-07-17 NOTE — Progress Notes (Signed)
Called to room to assist during endoscopic procedure.  Patient ID and intended procedure confirmed with present staff. Received instructions for my participation in the procedure from the performing physician.  

## 2021-07-17 NOTE — Progress Notes (Signed)
Pt's states no medical or surgical changes since previsit or office visit. 

## 2021-07-17 NOTE — Patient Instructions (Addendum)
Handouts were given to your care partner on polyps, diverticulosis, and hemorrhoids. NO ASPIRIN, ASPIRIN CONTAINING PRODUCTS (BC OR GOODY POWDERS) OR NSAIDS (IBUPROFEN, ADVIL, ALEVE, AND MOTRIN) FOR 5 days; TYLENOL IS OK TO TAKE if needed for pain or discomfort. You may resume your other current medications today. Await biopsy results.  May take 1-3 weeks to receive pathology results. Repeat colonoscopy in 1 year for surveillance. Please call if any questions or concerns.     YOU HAD AN ENDOSCOPIC PROCEDURE TODAY AT [facility]:   Refer to the procedure report that was given to you for any specific questions about what was found during the examination.  If the procedure report does not answer your questions, please call your gastroenterologist to clarify.  If you requested that your care partner not be given the details of your procedure findings, then the procedure report has been included in a sealed envelope for you to review at your convenience later.  YOU SHOULD EXPECT: Some feelings of bloating in the abdomen. Passage of more gas than usual.  Walking can help get rid of the air that was put into your GI tract during the procedure and reduce the bloating. If you had a lower endoscopy (such as a colonoscopy or flexible sigmoidoscopy) you may notice spotting of blood in your stool or on the toilet paper. If you underwent a bowel prep for your procedure, you may not have a normal bowel movement for a few days.  Please Note:  You might notice some irritation and congestion in your nose or some drainage.  This is from the oxygen used during your procedure.  There is no need for concern and it should clear up in a day or so.  SYMPTOMS TO REPORT IMMEDIATELY:  Following lower endoscopy (colonoscopy or flexible sigmoidoscopy):  Excessive amounts of blood in the stool  Significant tenderness or worsening of abdominal pains  Swelling of the abdomen that is new, acute  Fever of 100F  or higher  For urgent or emergent issues, a gastroenterologist can be reached at any hour by calling [phone]. Do not use MyChart messaging for urgent concerns.    DIET:  We do recommend a small meal at first, but then you may proceed to your regular diet.  Drink plenty of fluids but you should avoid alcoholic beverages for 24 hours.  ACTIVITY:  You should plan to take it easy for the rest of today and you should NOT DRIVE or use heavy machinery until tomorrow (because of the sedation medicines used during the test).    FOLLOW UP: Our staff will call the number listed on your records 24-72 hours following your procedure to check on you and address any questions or concerns that you may have regarding the information given to you following your procedure. If we do not reach you, we will leave a message.  We will attempt to reach you two times.  During this call, we will ask if you have developed any symptoms of COVID 19. If you develop any symptoms (ie: fever, flu-like symptoms, shortness of breath, cough etc.) before then, please call [phone].  If you test positive for Covid 19 in the 2 weeks post procedure, please call and report this information to [place].    If any biopsies were taken you will be contacted by phone or by letter within the next 1-3 weeks.  Please call us at [phone] if you have not heard about the biopsies in 3 weeks.  SIGNATURES/CONFIDENTIALITY: You and/or your care partner have signed paperwork which will be entered into your electronic medical record.  These signatures attest to the fact that that the information above on your After Visit Summary has been reviewed and is understood.  Full responsibility of the confidentiality of this discharge information lies with you and/or your care-partner.

## 2021-07-17 NOTE — Op Note (Addendum)
[facility] Patient Name: Richard Kramer Procedure Date: 07/17/2021 10:57 AM MRN: [id number] Endoscopist: Jackquline Denmark , MD Age: 71 Referring MD:  Date of Birth: 1950/09/22 Gender: Male Account #: [id number] Procedure:                Colonoscopy Indications:              High risk colon cancer surveillance: Personal                            history of colonic polyps. H/O large rectal polyp                            status post EMR 10/2 (Bx- TA). FS s/p EMR jan 2020 Medicines:                Monitored Anesthesia Care Procedure:                Pre-Anesthesia Assessment:                           - Prior to the procedure, a History and Physical                            was performed, and patient medications and                            allergies were reviewed. The patient's tolerance of                            previous anesthesia was also reviewed. The risks                            and benefits of the procedure and the sedation                            options and risks were discussed with the patient.                            All questions were answered, and informed consent                            was obtained. Prior Anticoagulants: The patient has                            taken no previous anticoagulant or antiplatelet                            agents. ASA Grade Assessment: II - A patient with                            mild systemic disease. After reviewing the risks                            and benefits, the patient was deemed in  satisfactory condition to undergo the procedure.                           After obtaining informed consent, the colonoscope                            was passed under direct vision. Throughout the                            procedure, the patient's blood pressure, pulse, and                            oxygen saturations were monitored continuously. The                            Olympus  PCF-H190DL (#[id number]) Colonoscope was                            introduced through the anus and advanced to the 2                            cm into the ileum. The colonoscopy was performed                            without difficulty. The patient tolerated the                            procedure well. The quality of the bowel                            preparation was good. The terminal ileum, ileocecal                            valve, appendiceal orifice, and rectum were                            photographed. Scope In: 45:40:98 AM Scope Out: 11:25:30 AM Scope Withdrawal Time: 0 hours 15 minutes 33 seconds  Total Procedure Duration: 0 hours 19 minutes 11 seconds  Findings:                 Three sessile polyps were found in the proximal                            transverse colon, mid ascending colon and distal                            ascending colon. The polyps were 8 to 10 mm in                            size. These polyps were removed with a cold snare.                            Resection and retrieval were complete.  A 20 mm polyp was found in the distal posterior                            rectum with underlying scar (at the site of                            previous polypectomy). The polyp was semi-sessile.                            The polyp was removed with a hot snare. Resection                            and retrieval were complete. The area around the                            polypectomy site was cauterized using the snare                            tip. Since there was no bleeding, we decided to                            hold off on clipping. Estimated blood loss: none.                           Multiple medium-mouthed diverticula were found in                            the sigmoid colon.                           Non-bleeding internal hemorrhoids were found during                            retroflexion. The hemorrhoids were small and  Grade                            I (internal hemorrhoids that do not prolapse).                           The terminal ileum appeared normal.                           The exam was otherwise without abnormality on                            direct and retroflexion views. Complications:            No immediate complications. Estimated Blood Loss:     Estimated blood loss: none. Impression:               - Three 8 to 10 mm polyps in the proximal                            transverse colon, in the mid ascending colon and in  the distal ascending colon, removed with a cold                            snare. Resected and retrieved.                           - One 20 mm polyp in the distal rectum, removed                            with a hot snare. Resected and retrieved.                           - Diverticulosis in the sigmoid colon.                           - Non-bleeding internal hemorrhoids.                           - The examined portion of the ileum was normal.                           - The examination was otherwise normal on direct                            and retroflexion views. Recommendation:           - Patient has a contact number available for                            emergencies. The signs and symptoms of potential                            delayed complications were discussed with the                            patient. Return to normal activities tomorrow.                            Written discharge instructions were provided to the                            patient.                           - Resume previous diet.                           - Continue present medications.                           - No aspirin, ibuprofen, naproxen, or other                            non-steroidal anti-inflammatory drugs for 5 days  after polyp removal.                           - Await pathology results.                            - Repeat colonoscopy in 1 year for surveillance                            based on pathology results.                           - The findings and recommendations were discussed                            with the patient's family. Jackquline Denmark, MD 07/17/2021 11:33:22 AM This report has been signed electronically.

## 2021-07-17 NOTE — Progress Notes (Signed)
To pacu, VSS. Report to [email or web address] 

## 2021-07-18 ENCOUNTER — Telehealth: Payer: Self-pay

## 2021-07-18 NOTE — Telephone Encounter (Signed)
  Follow up Call-     07/17/2021   10:42 AM  Call back number  Post procedure Call Back phone  # [phone]  Permission to leave phone message Yes     Patient questions:  Do you have a fever, pain , or abdominal swelling? No. Pain Score  0 *  Have you tolerated food without any problems? Yes.    Have you been able to return to your normal activities? Yes.    Do you have any questions about your discharge instructions: Diet   No. Medications  No. Follow up visit  No.  Do you have questions or concerns about your Care? No.  Actions: * If pain score is 4 or above: No action needed, pain <4.

## 2021-07-25 ENCOUNTER — Encounter: Payer: Self-pay | Admitting: Gastroenterology

## 2021-08-31 DIAGNOSIS — C44219 Basal cell carcinoma of skin of left ear and external auricular canal: Secondary | ICD-10-CM | POA: Diagnosis not present

## 2021-08-31 DIAGNOSIS — C44319 Basal cell carcinoma of skin of other parts of face: Secondary | ICD-10-CM | POA: Diagnosis not present

## 2021-11-08 DIAGNOSIS — Z23 Encounter for immunization: Secondary | ICD-10-CM | POA: Diagnosis not present

## 2022-01-14 DIAGNOSIS — R0981 Nasal congestion: Secondary | ICD-10-CM | POA: Diagnosis not present

## 2022-01-14 DIAGNOSIS — J069 Acute upper respiratory infection, unspecified: Secondary | ICD-10-CM | POA: Diagnosis not present

## 2022-01-14 DIAGNOSIS — R059 Cough, unspecified: Secondary | ICD-10-CM | POA: Diagnosis not present

## 2022-04-17 DIAGNOSIS — S32020A Wedge compression fracture of second lumbar vertebra, initial encounter for closed fracture: Secondary | ICD-10-CM | POA: Diagnosis not present

## 2022-04-17 DIAGNOSIS — M545 Low back pain, unspecified: Secondary | ICD-10-CM | POA: Diagnosis not present

## 2022-04-17 DIAGNOSIS — E7800 Pure hypercholesterolemia, unspecified: Secondary | ICD-10-CM | POA: Diagnosis not present

## 2022-04-17 DIAGNOSIS — I10 Essential (primary) hypertension: Secondary | ICD-10-CM | POA: Diagnosis not present

## 2022-04-25 DIAGNOSIS — S32000A Wedge compression fracture of unspecified lumbar vertebra, initial encounter for closed fracture: Secondary | ICD-10-CM | POA: Diagnosis not present

## 2022-05-01 ENCOUNTER — Encounter: Payer: Self-pay | Admitting: Gastroenterology

## 2022-05-03 DIAGNOSIS — S32020A Wedge compression fracture of second lumbar vertebra, initial encounter for closed fracture: Secondary | ICD-10-CM | POA: Diagnosis not present

## 2022-05-27 DIAGNOSIS — H524 Presbyopia: Secondary | ICD-10-CM | POA: Diagnosis not present

## 2022-05-27 DIAGNOSIS — H2513 Age-related nuclear cataract, bilateral: Secondary | ICD-10-CM | POA: Diagnosis not present

## 2022-06-03 DIAGNOSIS — E785 Hyperlipidemia, unspecified: Secondary | ICD-10-CM | POA: Diagnosis not present

## 2022-06-03 DIAGNOSIS — R7301 Impaired fasting glucose: Secondary | ICD-10-CM | POA: Diagnosis not present

## 2022-06-03 DIAGNOSIS — Z79899 Other long term (current) drug therapy: Secondary | ICD-10-CM | POA: Diagnosis not present

## 2022-06-18 DIAGNOSIS — S32000A Wedge compression fracture of unspecified lumbar vertebra, initial encounter for closed fracture: Secondary | ICD-10-CM | POA: Diagnosis not present

## 2022-06-19 DIAGNOSIS — T18128A Food in esophagus causing other injury, initial encounter: Secondary | ICD-10-CM | POA: Diagnosis not present

## 2022-06-19 DIAGNOSIS — W19XXXD Unspecified fall, subsequent encounter: Secondary | ICD-10-CM | POA: Diagnosis not present

## 2022-06-19 DIAGNOSIS — Z743 Need for continuous supervision: Secondary | ICD-10-CM | POA: Diagnosis not present

## 2022-06-19 DIAGNOSIS — T17920A Food in respiratory tract, part unspecified causing asphyxiation, initial encounter: Secondary | ICD-10-CM | POA: Diagnosis not present

## 2022-06-19 DIAGNOSIS — R079 Chest pain, unspecified: Secondary | ICD-10-CM | POA: Diagnosis not present

## 2022-06-20 ENCOUNTER — Telehealth: Payer: Self-pay | Admitting: Gastroenterology

## 2022-06-20 NOTE — Telephone Encounter (Signed)
Spoke with patient's wife & she stated patient was seen at [facility] ED last night and was advised to make an appointment with our office d/t recent dysphagia. She says this is the 4th or 5th time patient has had food stuck in his throat. Recall for colon due 06/2022. She did mention that he is recovering from surgery on L2 of his vertebrae. He was last seen for colon on 07/17/21 with Dr. Chales Abrahams. OV scheduled for 06/27/22 at 10:30 am with Amy, PA. Advised patient to continue to chew food really well & discussed ED precautions if needed prior to OV. Wife verbalized all understanding.

## 2022-06-20 NOTE — Telephone Encounter (Signed)
Inbound call from wife, would like to discuss Endoscopy and throat stretched, states he went to the hospital at [facility]. States he is having trouble swallowing and would like to schedule appointment as soon as possible

## 2022-06-25 DIAGNOSIS — J3089 Other allergic rhinitis: Secondary | ICD-10-CM | POA: Diagnosis not present

## 2022-06-25 DIAGNOSIS — S32020S Wedge compression fracture of second lumbar vertebra, sequela: Secondary | ICD-10-CM | POA: Diagnosis not present

## 2022-06-25 DIAGNOSIS — E785 Hyperlipidemia, unspecified: Secondary | ICD-10-CM | POA: Diagnosis not present

## 2022-06-25 DIAGNOSIS — I10 Essential (primary) hypertension: Secondary | ICD-10-CM | POA: Diagnosis not present

## 2022-06-25 DIAGNOSIS — K2100 Gastro-esophageal reflux disease with esophagitis, without bleeding: Secondary | ICD-10-CM | POA: Diagnosis not present

## 2022-06-25 DIAGNOSIS — K582 Mixed irritable bowel syndrome: Secondary | ICD-10-CM | POA: Diagnosis not present

## 2022-06-25 DIAGNOSIS — M810 Age-related osteoporosis without current pathological fracture: Secondary | ICD-10-CM | POA: Diagnosis not present

## 2022-06-25 DIAGNOSIS — N138 Other obstructive and reflux uropathy: Secondary | ICD-10-CM | POA: Diagnosis not present

## 2022-06-25 DIAGNOSIS — Z0000 Encounter for general adult medical examination without abnormal findings: Secondary | ICD-10-CM | POA: Diagnosis not present

## 2022-06-27 ENCOUNTER — Ambulatory Visit: Payer: Medicare Other | Admitting: Physician Assistant

## 2022-07-05 ENCOUNTER — Encounter: Payer: Self-pay | Admitting: Gastroenterology

## 2022-07-05 ENCOUNTER — Ambulatory Visit: Payer: Medicare Other | Admitting: Gastroenterology

## 2022-07-05 VITALS — BP 126/70 | HR 67 | Ht 70.0 in | Wt 150.0 lb

## 2022-07-05 DIAGNOSIS — Z860100 Personal history of colon polyps, unspecified: Secondary | ICD-10-CM | POA: Insufficient documentation

## 2022-07-05 DIAGNOSIS — R1310 Dysphagia, unspecified: Secondary | ICD-10-CM | POA: Diagnosis not present

## 2022-07-05 MED ORDER — NA SULFATE-K SULFATE-MG SULF 17.5-3.13-1.6 GM/177ML PO SOLN: 1.0000 | Freq: Once | ORAL | 0 refills | Status: AC

## 2022-07-05 NOTE — Progress Notes (Signed)
07/05/2022 Richard Kramer [id number] [birth year]   HISTORY OF PRESENT ILLNESS: This is a 72 year old male who is a patient of Dr. Urban Gibson.  He is here today to discuss an EGD and a colonoscopy.  Colonoscopy June 2023 he had an 8 mm, 10 mm, 20 mm polyp removed.  1. Surgical [P], colon, transverse and ascending, polyp (3) - TUBULAR ADENOMA, FRAGMENTS. 2. Surgical [P], colon, rectum, polyp (1) - SUPERFICIAL FRAGMENTS OF A TUBULOVILLOUS ADENOMA.  Repeat was recommended in 1 year interval.  No issues with moving his bowels and no rectal bleeding.  He has had issues with swallowing, solid food getting stuck intermittently for the past couple years, but it has worsened and progressed.  He says that foods like steak, beef, etc. get stuck.  A couple of weeks ago he had to go to the emergency department at [facility] for food impaction after he ate some roast beef and it would not pass on its own like it usually does.  When he got there they gave him some GI cocktail, which immediately came back up, but then the food passed on its own after a short time.  He has been on Pepcid 40 mg daily for acid reflux, but at the ER they added omeprazole 40 mg daily.  He says that since that episode he has just been taking very small bites, avoiding certain foods, chewing very thoroughly, etc.  He has never had an EGD in the past.  Past Medical History:  Diagnosis Date   Allergy    seasonal   Cataract    right   Colon polyps 2013   Elevated cholesterol    GERD (gastroesophageal reflux disease)    every now and then   Hypertension    IBS (irritable bowel syndrome)    with Diarrhea dx by Dr. Lorin Picket   Melanoma [facility]) 1979   Right lower back   Pneumonia 1995   Past Surgical History:  Procedure Laterality Date   COLONOSCOPY  05/28/2017   CRANIOTOMY  10/19/2011   Procedure: CRANIOTOMY HEMATOMA EVACUATION SUBDURAL;  Surgeon: Barnett Abu, MD;  Location: [facility]UR[facility] OR[facility];  Service: Neurosurgery;   Laterality: Left;  Craniotomy for subdural hematoma   FLEXIBLE SIGMOIDOSCOPY N/A 02/24/2018   Procedure: FLEXIBLE SIGMOIDOSCOPY;  Surgeon: Lynann Bologna, MD;  Location: [facility] ENDOSCOPY;  Service: Endoscopy;  Laterality: N/A;   MOHS SURGERY     left ear,under left eye   mole removed from back  1979   POLYPECTOMY  02/24/2018   Procedure: POLYPECTOMY;  Surgeon: Lynann Bologna, MD;  Location: [facility] ENDOSCOPY;  Service: Endoscopy;;   POLYPECTOMY      reports that he quit smoking about 14 years ago. His smoking use included cigarettes. He smoked an average of 1 pack per day. He has never been exposed to tobacco smoke. He has never used smokeless tobacco. He reports current alcohol use of about 35.0 standard drinks of alcohol per week. He reports that he does not use drugs. family history includes Cancer in his maternal aunt and maternal aunt; Heart disease in his mother; Pancreatic cancer in his sister. Allergies  Allergen Reactions   Penicillins Rash and Other (See Comments)    Did it involve swelling of the face/tongue/throat, SOB, or low BP? No Did it involve sudden or severe rash/hives, skin peeling, or any reaction on the inside of your mouth or nose? No Did you need to seek medical attention at a hospital or doctor's office? Yes When did it last  happen? 40 or more years ago If all above answers are "NO", may proceed with cephalosporin use.       Outpatient Encounter Medications as of 07/05/2022  Medication Sig   acetaminophen (TYLENOL) 325 MG tablet Take 650 mg by mouth every 6 (six) hours as needed for moderate pain or headache.    alendronate (FOSAMAX) 70 MG tablet Take 70 mg by mouth once a week.   Calcium Carb-Cholecalciferol (CALCIUM 500+D3 PO) Take by mouth daily. Calcium 500 iu and d3 630 mg   dicyclomine (BENTYL) 10 MG capsule Take 10 mg by mouth 4 (four) times daily as needed for spasms.    famotidine (PEPCID) 40 MG tablet Take 40 mg by mouth daily.   fluticasone (FLONASE) 50 MCG/ACT  nasal spray Place into both nostrils as needed.   levocetirizine (XYZAL) 5 MG tablet Take 5 mg by mouth as needed for allergies.   lisinopril (PRINIVIL,ZESTRIL) 20 MG tablet Take 20 mg by mouth every morning.   Multiple Vitamin (MULTIVITAMIN) tablet Take 1 tablet by mouth daily.   omeprazole (PRILOSEC) 40 MG capsule Take 40 mg by mouth daily.   Probiotic Product (PROBIOTIC PO) Take by mouth daily. Take one daily supports oral care.   rosuvastatin (CRESTOR) 10 MG tablet Take 10 mg by mouth daily.   sildenafil (REVATIO) 20 MG tablet Take 40 mg by mouth daily as needed (for ED).   tamsulosin (FLOMAX) 0.4 MG CAPS capsule Take 0.4 mg by mouth daily.   No facility-administered encounter medications on file as of 07/05/2022.     REVIEW OF SYSTEMS  : All other systems reviewed and negative except where noted in the History of Present Illness.   PHYSICAL EXAM: BP 126/70   Pulse 67   Ht 5\' 10"  (1.778 m)   Wt 150 lb (68 kg)   SpO2 97%   BMI 21.52 kg/m  General: Well developed white male in no acute distress Head: Normocephalic and atraumatic Eyes:  Sclerae anicteric, conjunctiva pink. Ears: Normal auditory acuity Lungs: Clear throughout to auscultation; no W/R/R. Heart: Regular rate and rhythm; no M/R/G. Abdomen: Soft, non-distended.  BS present.  Non-tender. Rectal: Will be done at the time of colonoscopy. Musculoskeletal: Symmetrical with no gross deformities  Skin: No lesions on visible extremities Extremities: No edema  Neurological: Alert oriented x 4, grossly non-focal Psychological:  Alert and cooperative. Normal mood and affect  ASSESSMENT AND PLAN: *Personal history of large colon polyps, tubular adenomas and a 20 mm tubulovillous adenoma on colonoscopy in June 2023.  Repeat recommended in 1 year.  Will schedule Dr. Chales Abrahams. *Dysphagia: Dysphagia to solid foods, progressively worsening over the past couple of years.  Had ED visit at [facility] for food impaction last month.  Never  had an EGD in the past.  Will schedule for EGD with possible dilation as well.  He had been on famotidine 40 mg daily for acid reflux, but they added omeprazole 40 mg daily at his recent ER visit.  He should take that in the morning before breakfast and can take the famotidine before bedtime.  **The risks, benefits, and alternatives to EGD with dilation and colonoscopy were discussed with the patient and he consents to proceed.  CC:  [address], [place], [place]

## 2022-07-05 NOTE — Patient Instructions (Signed)
You have been scheduled for an endoscopy and colonoscopy. Please follow the written instructions given to you at your visit today. Please pick up your prep supplies at the pharmacy within the next 1-3 days. If you use inhalers (even only as needed), please bring them with you on the day of your procedure.  _______________________________________________________  If your blood pressure at your visit was 140/90 or greater, please contact your primary care physician to follow up on this.  _______________________________________________________  If you are age 72 or older, your body mass index should be between 23-30. Your Body mass index is 21.52 kg/m. If this is out of the aforementioned range listed, please consider follow up with your Primary Care Provider.  If you are age 18 or younger, your body mass index should be between 19-25. Your Body mass index is 21.52 kg/m. If this is out of the aformentioned range listed, please consider follow up with your Primary Care Provider.   ________________________________________________________  The [facility] providers would like to encourage you to use [facility] to communicate with providers for non-urgent requests or questions.  Due to long hold times on the telephone, sending your provider a message by [facility] may be a faster and more efficient way to get a response.  Please allow 48 business hours for a response.  Please remember that this is for non-urgent requests.  _______________________________________________________

## 2022-07-09 NOTE — Progress Notes (Signed)
Agree with assessment/plan.  Raj Audrena Talaga, MD [facility] [phone]  

## 2022-08-19 ENCOUNTER — Encounter: Payer: Self-pay | Admitting: Gastroenterology

## 2022-08-22 ENCOUNTER — Ambulatory Visit (AMBULATORY_SURGERY_CENTER): Payer: Medicare Other | Admitting: Gastroenterology

## 2022-08-22 ENCOUNTER — Encounter: Payer: Self-pay | Admitting: Gastroenterology

## 2022-08-22 VITALS — BP 143/86 | HR 79 | Temp 98.6°F | Resp 12 | Ht 70.0 in | Wt 150.0 lb

## 2022-08-22 DIAGNOSIS — Z09 Encounter for follow-up examination after completed treatment for conditions other than malignant neoplasm: Secondary | ICD-10-CM

## 2022-08-22 DIAGNOSIS — K2950 Unspecified chronic gastritis without bleeding: Secondary | ICD-10-CM | POA: Diagnosis not present

## 2022-08-22 DIAGNOSIS — K229 Disease of esophagus, unspecified: Secondary | ICD-10-CM | POA: Diagnosis not present

## 2022-08-22 DIAGNOSIS — R1310 Dysphagia, unspecified: Secondary | ICD-10-CM

## 2022-08-22 DIAGNOSIS — K317 Polyp of stomach and duodenum: Secondary | ICD-10-CM

## 2022-08-22 DIAGNOSIS — D122 Benign neoplasm of ascending colon: Secondary | ICD-10-CM

## 2022-08-22 DIAGNOSIS — Z86010 Personal history of colonic polyps: Secondary | ICD-10-CM

## 2022-08-22 DIAGNOSIS — K222 Esophageal obstruction: Secondary | ICD-10-CM | POA: Diagnosis not present

## 2022-08-22 MED ORDER — OMEPRAZOLE 40 MG PO CPDR: 40.0000 mg | DELAYED_RELEASE_CAPSULE | Freq: Every day | ORAL | 3 refills | Status: DC

## 2022-08-22 MED ORDER — SODIUM CHLORIDE 0.9 % IV SOLN: 500.0000 mL | Freq: Once | INTRAVENOUS | Status: DC

## 2022-08-22 NOTE — Op Note (Signed)
[facility] Patient Name: Richard Kramer Procedure Date: 08/22/2022 10:26 AM MRN: [id number] Endoscopist: Lynann Bologna , MD, [id number] Age: 72 Referring MD:  Date of Birth: 02/19/1950 Gender: Male Account #: [id number] Procedure:                Colonoscopy Indications:              High risk colon cancer surveillance: Personal                            history of advanced colon polyps s/p EMR 06/2021 Medicines:                Monitored Anesthesia Care Procedure:                Pre-Anesthesia Assessment:                           - Prior to the procedure, a History and Physical                            was performed, and patient medications and                            allergies were reviewed. The patient's tolerance of                            previous anesthesia was also reviewed. The risks                            and benefits of the procedure and the sedation                            options and risks were discussed with the patient.                            All questions were answered, and informed consent                            was obtained. Prior Anticoagulants: The patient has                            taken no anticoagulant or antiplatelet agents. ASA                            Grade Assessment: II - A patient with mild systemic                            disease. After reviewing the risks and benefits,                            the patient was deemed in satisfactory condition to                            undergo the procedure.  After obtaining informed consent, the colonoscope                            was passed under direct vision. Throughout the                            procedure, the patient's blood pressure, pulse, and                            oxygen saturations were monitored continuously. The                            CF [id number] #[id number] was introduced through the anus                            and advanced to  the the cecum, identified by                            appendiceal orifice and ileocecal valve. The                            colonoscopy was performed without difficulty. The                            patient tolerated the procedure well. The quality                            of the bowel preparation was good. The ileocecal                            valve, appendiceal orifice, and rectum were                            photographed. Scope In: 10:41:14 AM Scope Out: 10:51:39 AM Scope Withdrawal Time: 0 hours 7 minutes 38 seconds  Total Procedure Duration: 0 hours 10 minutes 25 seconds  Findings:                 A 4 mm polyp was found in the proximal ascending                            colon. The polyp was sessile. The polyp was removed                            with a cold snare. Resection and retrieval were                            complete.                           Multiple medium-mouthed diverticula were found in                            the sigmoid colon and descending colon.  Non-bleeding internal hemorrhoids were found during                            retroflexion and during perianal exam. The                            hemorrhoids were moderate and Grade I (internal                            hemorrhoids that do not prolapse).                           The exam was otherwise without abnormality on                            direct and retroflexion views. Complications:            No immediate complications. Estimated Blood Loss:     Estimated blood loss: none. Impression:               - One 4 mm polyp in the proximal ascending colon,                            removed with a cold snare. Resected and retrieved.                           - Moderate left colonic diverticulosis.                           - Non-bleeding internal hemorrhoids.                           - The examination was otherwise normal on direct                            and  retroflexion views. Recommendation:           - Patient has a contact number available for                            emergencies. The signs and symptoms of potential                            delayed complications were discussed with the                            patient. Return to normal activities tomorrow.                            Written discharge instructions were provided to the                            patient.                           - Resume previous diet.                           -  Continue present medications.                           - Await pathology results.                           - Repeat colonoscopy in 3 years for surveillance.                           - The findings and recommendations were discussed                            with the patient's family. Lynann Bologna, MD 08/22/2022 11:00:23 AM This report has been signed electronically.

## 2022-08-22 NOTE — Patient Instructions (Signed)
YOU HAD AN ENDOSCOPIC PROCEDURE TODAY AT THE [facility]:   Refer to the procedure report that was given to you for any specific questions about what was found during the examination.  If the procedure report does not answer your questions, please call your gastroenterologist to clarify.  If you requested that your care partner not be given the details of your procedure findings, then the procedure report has been included in a sealed envelope for you to review at your convenience later.  YOU SHOULD EXPECT: Some feelings of bloating in the abdomen. Passage of more gas than usual.  Walking can help get rid of the air that was put into your GI tract during the procedure and reduce the bloating. If you had a lower endoscopy (such as a colonoscopy or flexible sigmoidoscopy) you may notice spotting of blood in your stool or on the toilet paper. If you underwent a bowel prep for your procedure, you may not have a normal bowel movement for a few days.  Please Note:  You might notice some irritation and congestion in your nose or some drainage.  This is from the oxygen used during your procedure.  There is no need for concern and it should clear up in a day or so.  SYMPTOMS TO REPORT IMMEDIATELY:  Following lower endoscopy (colonoscopy or flexible sigmoidoscopy):  Excessive amounts of blood in the stool  Significant tenderness or worsening of abdominal pains  Swelling of the abdomen that is new, acute  Fever of 100F or higher  Following upper endoscopy (EGD)  Vomiting of blood or coffee ground material  New chest pain or pain under the shoulder blades  Painful or persistently difficult swallowing  New shortness of breath  Fever of 100F or higher  Black, tarry-looking stools  For urgent or emergent issues, a gastroenterologist can be reached at any hour by calling [phone]. Do not use MyChart messaging for urgent concerns.    DIET:  Dilation diet today, but then you may advance  your diet to your regular diet tomorrow.  Drink plenty of fluids but you should avoid alcoholic beverages for 24 hours.  ACTIVITY:  You should plan to take it easy for the rest of today and you should NOT DRIVE or use heavy machinery until tomorrow (because of the sedation medicines used during the test).    FOLLOW UP: Our staff will call the number listed on your records the next business day following your procedure.  We will call around 7:15- 8:00 am to check on you and address any questions or concerns that you may have regarding the information given to you following your procedure. If we do not reach you, we will leave a message.     If any biopsies were taken you will be contacted by phone or by letter within the next 1-3 weeks.  Please call us at [phone] if you have not heard about the biopsies in 3 weeks.    SIGNATURES/CONFIDENTIALITY: You and/or your care partner have signed paperwork which will be entered into your electronic medical record.  These signatures attest to the fact that that the information above on your After Visit Summary has been reviewed and is understood.  Full responsibility of the confidentiality of this discharge information lies with you and/or your care-partner.

## 2022-08-22 NOTE — Op Note (Signed)
[facility] Patient Name: Richard Kramer Procedure Date: 08/22/2022 10:26 AM MRN: [id number] Endoscopist: Lynann Bologna , MD, [id number] Age: 72 Referring MD:  Date of Birth: 1950/10/14 Gender: Male Account #: [id number] Procedure:                Upper GI endoscopy Indications:              Dysphagia with history of food impaction s/p                            spontaneous disimpaction few months ago. Medicines:                Monitored Anesthesia Care Procedure:                Pre-Anesthesia Assessment:                           - Prior to the procedure, a History and Physical                            was performed, and patient medications and                            allergies were reviewed. The patient's tolerance of                            previous anesthesia was also reviewed. The risks                            and benefits of the procedure and the sedation                            options and risks were discussed with the patient.                            All questions were answered, and informed consent                            was obtained. Prior Anticoagulants: The patient has                            taken no anticoagulant or antiplatelet agents. ASA                            Grade Assessment: II - A patient with mild systemic                            disease. After reviewing the risks and benefits,                            the patient was deemed in satisfactory condition to                            undergo the procedure.  After obtaining informed consent, the endoscope was                            passed under direct vision. Throughout the                            procedure, the patient's blood pressure, pulse, and                            oxygen saturations were monitored continuously. The                            GIF [id number] #[id number] was introduced through the                            mouth, and advanced to  the second part of duodenum.                            The upper GI endoscopy was accomplished without                            difficulty. The patient tolerated the procedure                            well. Scope In: Scope Out: Findings:                 The lower third of the esophagus was moderately                            tortuous with few small whitish lesions. Biopsies                            were obtained from the proximal and distal                            esophagus with cold forceps for histology of                            suspected eosinophilic esophagitis/mild Candida                            esophagitis.                           A mild Schatzki ring was found at the                            gastroesophageal junction. The scope was withdrawn.                            Dilation was performed with a Maloney dilator with                            mild resistance at 50 Fr and 52 Fr.  A small hiatal hernia was present.                           The entire examined stomach was normal. Biopsies                            were taken with a cold forceps for histology.                           A single 10 mm sessile polyp with no bleeding was                            found in the first portion of the duodenum.                            Biopsies were taken with a cold forceps for                            histology.                           The exam was otherwise without abnormality. Complications:            No immediate complications. Estimated Blood Loss:     Estimated blood loss: none. Impression:               - Schatzki ring. Dilated.                           - Small hiatal hernia.                           - A single duodenal polyp. Biopsied.                           - The examination was otherwise normal.                           - Biopsies were taken with a cold forceps for                            evaluation of  eosinophilic esophagitis. Recommendation:           - Patient has a contact number available for                            emergencies. The signs and symptoms of potential                            delayed complications were discussed with the                            patient. Return to normal activities tomorrow.                            Written discharge instructions were provided to the  patient.                           - Post dilatation diet.                           - Continue present medications including omeprazole                            40 mg p.o. daily.                           - Await pathology results.                           - Chew foods specially meats and breads well and                            eat slowly.                           - The findings and recommendations were discussed                            with the patient's family. Lynann Bologna, MD 08/22/2022 10:57:31 AM This report has been signed electronically.

## 2022-08-22 NOTE — Progress Notes (Signed)
07/05/2022 Richard Kramer [id number] [birth year]     HISTORY OF PRESENT ILLNESS: This is a 72 year old male who is a patient of Dr. Urban Gibson.  He is here today to discuss an EGD and a colonoscopy.   Colonoscopy June 2023 he had an 8 mm, 10 mm, 20 mm polyp removed.   1. Surgical [P], colon, transverse and ascending, polyp (3) - TUBULAR ADENOMA, FRAGMENTS. 2. Surgical [P], colon, rectum, polyp (1) - SUPERFICIAL FRAGMENTS OF A TUBULOVILLOUS ADENOMA.   Repeat was recommended in 1 year interval.   No issues with moving his bowels and no rectal bleeding.   He has had issues with swallowing, solid food getting stuck intermittently for the past couple years, but it has worsened and progressed.  He says that foods like steak, beef, etc. get stuck.  A couple of weeks ago he had to go to the emergency department at [facility] for food impaction after he ate some roast beef and it would not pass on its own like it usually does.  When he got there they gave him some GI cocktail, which immediately came back up, but then the food passed on its own after a short time.  He has been on Pepcid 40 mg daily for acid reflux, but at the ER they added omeprazole 40 mg daily.  He says that since that episode he has just been taking very small bites, avoiding certain foods, chewing very thoroughly, etc.  He has never had an EGD in the past.       Past Medical History:  Diagnosis Date   Allergy      seasonal   Cataract      right   Colon polyps 2013   Elevated cholesterol     GERD (gastroesophageal reflux disease)      every now and then   Hypertension     IBS (irritable bowel syndrome)      with Diarrhea dx by Dr. Lorin Picket   Melanoma [facility]) 1979    Right lower back   Pneumonia 1995             Past Surgical History:  Procedure Laterality Date   COLONOSCOPY   05/28/2017   CRANIOTOMY   10/19/2011    Procedure: CRANIOTOMY HEMATOMA EVACUATION SUBDURAL;  Surgeon: Barnett Abu, MD;  Location: [facility]  NEURO ORS;  Service: Neurosurgery;  Laterality: Left;  Craniotomy for subdural hematoma   FLEXIBLE SIGMOIDOSCOPY N/A 02/24/2018    Procedure: FLEXIBLE SIGMOIDOSCOPY;  Surgeon: Lynann Bologna, MD;  Location: [facility] ENDOSCOPY;  Service: Endoscopy;  Laterality: N/A;   MOHS SURGERY        left ear,under left eye   mole removed from back   1979   POLYPECTOMY   02/24/2018    Procedure: POLYPECTOMY;  Surgeon: Lynann Bologna, MD;  Location: [facility] ENDOSCOPY;  Service: Endoscopy;;   POLYPECTOMY             reports that he quit smoking about 14 years ago. His smoking use included cigarettes. He smoked an average of 1 pack per day. He has never been exposed to tobacco smoke. He has never used smokeless tobacco. He reports current alcohol use of about 35.0 standard drinks of alcohol per week. He reports that he does not use drugs. family history includes Cancer in his maternal aunt and maternal aunt; Heart disease in his mother; Pancreatic cancer in his sister. Allergies       Allergies  Allergen Reactions   Penicillins Rash  and Other (See Comments)      Did it involve swelling of the face/tongue/throat, SOB, or low BP? No Did it involve sudden or severe rash/hives, skin peeling, or any reaction on the inside of your mouth or nose? No Did you need to seek medical attention at a hospital or doctor's office? Yes When did it last happen? 40 or more years ago If all above answers are "NO", may proceed with cephalosporin use.                Outpatient Encounter Medications as of 07/05/2022  Medication Sig   acetaminophen (TYLENOL) 325 MG tablet Take 650 mg by mouth every 6 (six) hours as needed for moderate pain or headache.    alendronate (FOSAMAX) 70 MG tablet Take 70 mg by mouth once a week.   Calcium Carb-Cholecalciferol (CALCIUM 500+D3 PO) Take by mouth daily. Calcium 500 iu and d3 630 mg   dicyclomine (BENTYL) 10 MG capsule Take 10 mg by mouth 4 (four) times daily as needed for spasms.    famotidine  (PEPCID) 40 MG tablet Take 40 mg by mouth daily.   fluticasone (FLONASE) 50 MCG/ACT nasal spray Place into both nostrils as needed.   levocetirizine (XYZAL) 5 MG tablet Take 5 mg by mouth as needed for allergies.   lisinopril (PRINIVIL,ZESTRIL) 20 MG tablet Take 20 mg by mouth every morning.   Multiple Vitamin (MULTIVITAMIN) tablet Take 1 tablet by mouth daily.   omeprazole (PRILOSEC) 40 MG capsule Take 40 mg by mouth daily.   Probiotic Product (PROBIOTIC PO) Take by mouth daily. Take one daily supports oral care.   rosuvastatin (CRESTOR) 10 MG tablet Take 10 mg by mouth daily.   sildenafil (REVATIO) 20 MG tablet Take 40 mg by mouth daily as needed (for ED).   tamsulosin (FLOMAX) 0.4 MG CAPS capsule Take 0.4 mg by mouth daily.      No facility-administered encounter medications on file as of 07/05/2022.          REVIEW OF SYSTEMS  : All other systems reviewed and negative except where noted in the History of Present Illness.     PHYSICAL EXAM: BP 126/70   Pulse 67   Ht 5\' 10"  (1.778 m)   Wt 150 lb (68 kg)   SpO2 97%   BMI 21.52 kg/m  General: Well developed white male in no acute distress Head: Normocephalic and atraumatic Eyes:  Sclerae anicteric, conjunctiva pink. Ears: Normal auditory acuity Lungs: Clear throughout to auscultation; no W/R/R. Heart: Regular rate and rhythm; no M/R/G. Abdomen: Soft, non-distended.  BS present.  Non-tender. Rectal: Will be done at the time of colonoscopy. Musculoskeletal: Symmetrical with no gross deformities  Skin: No lesions on visible extremities Extremities: No edema  Neurological: Alert oriented x 4, grossly non-focal Psychological:  Alert and cooperative. 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  Repeat recommended in 1 year.  Will schedule Dr. Chales Abrahams. *Dysphagia: Dysphagia to solid foods, progressively worsening  over the past couple of years.  Had ED visit at [facility] for food impaction last month.  Never had an EGD in the past.  Will schedule for EGD with possible dilation as well.  He had been on famotidine 40 mg daily for acid reflux, but they added omeprazole 40 mg daily at his recent ER visit.  He should take that in the morning before breakfast  and can take the famotidine before bedtime.   **The risks, benefits, and alternatives to EGD with dilation and colonoscopy were discussed with the patient and he consents to proceed.   CC:  Street, Stephanie Coup, *    Attending physician's note   I have taken history, reviewed the chart and examined the patient. I performed a substantive portion of this encounter, including complete performance of at least one of the key components, in conjunction with the APP. I agree with the Advanced Practitioner's note, impression and recommendations.    Edman Circle, MD [facility] [phone]

## 2022-08-22 NOTE — Progress Notes (Signed)
Uneventful anesthetic. Report to pacu rn. Vss. Care resumed by rn. 

## 2022-08-22 NOTE — Progress Notes (Signed)
Called to room to assist during endoscopic procedure.  Patient ID and intended procedure confirmed with present staff. Received instructions for my participation in the procedure from the performing physician.  

## 2022-08-23 ENCOUNTER — Telehealth: Payer: Self-pay

## 2022-08-23 NOTE — Telephone Encounter (Signed)
  Follow up Call-     08/22/2022    9:34 AM 07/17/2021   10:42 AM  Call back number  Post procedure Call Back phone  # [phone]  Permission to leave phone message Yes Yes     Patient questions:  Do you have a fever, pain , or abdominal swelling? No. Pain Score  0 *  Have you tolerated food without any problems? Yes.    Have you been able to return to your normal activities? Yes.    Do you have any questions about your discharge instructions: Diet   No. Medications  No. Follow up visit  No.  Do you have questions or concerns about your Care? No.  Actions: * If pain score is 4 or above: No action needed, pain <4.

## 2022-08-29 ENCOUNTER — Encounter: Payer: Self-pay | Admitting: Gastroenterology

## 2022-11-14 DIAGNOSIS — Z23 Encounter for immunization: Secondary | ICD-10-CM | POA: Diagnosis not present

## 2022-12-25 DIAGNOSIS — K582 Mixed irritable bowel syndrome: Secondary | ICD-10-CM | POA: Diagnosis not present

## 2022-12-25 DIAGNOSIS — N138 Other obstructive and reflux uropathy: Secondary | ICD-10-CM | POA: Diagnosis not present

## 2022-12-25 DIAGNOSIS — I10 Essential (primary) hypertension: Secondary | ICD-10-CM | POA: Diagnosis not present

## 2022-12-25 DIAGNOSIS — M810 Age-related osteoporosis without current pathological fracture: Secondary | ICD-10-CM | POA: Diagnosis not present

## 2022-12-25 DIAGNOSIS — K2100 Gastro-esophageal reflux disease with esophagitis, without bleeding: Secondary | ICD-10-CM | POA: Diagnosis not present

## 2022-12-25 DIAGNOSIS — E785 Hyperlipidemia, unspecified: Secondary | ICD-10-CM | POA: Diagnosis not present

## 2022-12-25 DIAGNOSIS — J3089 Other allergic rhinitis: Secondary | ICD-10-CM | POA: Diagnosis not present

## 2022-12-25 DIAGNOSIS — I700 Atherosclerosis of aorta: Secondary | ICD-10-CM | POA: Diagnosis not present

## 2022-12-25 DIAGNOSIS — Z6821 Body mass index (BMI) 21.0-21.9, adult: Secondary | ICD-10-CM | POA: Diagnosis not present

## 2022-12-25 DIAGNOSIS — S32020S Wedge compression fracture of second lumbar vertebra, sequela: Secondary | ICD-10-CM | POA: Diagnosis not present

## 2023-05-13 DIAGNOSIS — M7711 Lateral epicondylitis, right elbow: Secondary | ICD-10-CM | POA: Diagnosis not present

## 2023-05-28 DIAGNOSIS — H524 Presbyopia: Secondary | ICD-10-CM | POA: Diagnosis not present

## 2023-05-28 DIAGNOSIS — H25813 Combined forms of age-related cataract, bilateral: Secondary | ICD-10-CM | POA: Diagnosis not present

## 2023-06-22 ENCOUNTER — Other Ambulatory Visit: Payer: Self-pay | Admitting: Gastroenterology

## 2023-06-24 DIAGNOSIS — R7301 Impaired fasting glucose: Secondary | ICD-10-CM | POA: Diagnosis not present

## 2023-06-24 DIAGNOSIS — Z79899 Other long term (current) drug therapy: Secondary | ICD-10-CM | POA: Diagnosis not present

## 2023-06-24 DIAGNOSIS — E785 Hyperlipidemia, unspecified: Secondary | ICD-10-CM | POA: Diagnosis not present

## 2023-06-27 DIAGNOSIS — S32020S Wedge compression fracture of second lumbar vertebra, sequela: Secondary | ICD-10-CM | POA: Diagnosis not present

## 2023-06-27 DIAGNOSIS — K2100 Gastro-esophageal reflux disease with esophagitis, without bleeding: Secondary | ICD-10-CM | POA: Diagnosis not present

## 2023-06-27 DIAGNOSIS — Z0000 Encounter for general adult medical examination without abnormal findings: Secondary | ICD-10-CM | POA: Diagnosis not present

## 2023-06-27 DIAGNOSIS — K582 Mixed irritable bowel syndrome: Secondary | ICD-10-CM | POA: Diagnosis not present

## 2023-06-27 DIAGNOSIS — I10 Essential (primary) hypertension: Secondary | ICD-10-CM | POA: Diagnosis not present

## 2023-06-27 DIAGNOSIS — M810 Age-related osteoporosis without current pathological fracture: Secondary | ICD-10-CM | POA: Diagnosis not present

## 2023-06-27 DIAGNOSIS — J3089 Other allergic rhinitis: Secondary | ICD-10-CM | POA: Diagnosis not present

## 2023-06-27 DIAGNOSIS — E785 Hyperlipidemia, unspecified: Secondary | ICD-10-CM | POA: Diagnosis not present

## 2023-06-27 DIAGNOSIS — N138 Other obstructive and reflux uropathy: Secondary | ICD-10-CM | POA: Diagnosis not present

## 2023-06-27 DIAGNOSIS — I700 Atherosclerosis of aorta: Secondary | ICD-10-CM | POA: Diagnosis not present

## 2023-10-31 DIAGNOSIS — Z23 Encounter for immunization: Secondary | ICD-10-CM | POA: Diagnosis not present
